# Patient Record
Sex: Male | Born: 1935 | Race: Black or African American | Hispanic: No | Smoking: Former smoker
Health system: Southern US, Community
[De-identification: ages and names within clinical notes are randomized; demographics above are authoritative.]

## PROBLEM LIST (undated history)

## (undated) DIAGNOSIS — A419 Sepsis, unspecified organism: Secondary | ICD-10-CM

## (undated) DIAGNOSIS — R6520 Severe sepsis without septic shock: Secondary | ICD-10-CM

## (undated) DIAGNOSIS — I10 Essential (primary) hypertension: Secondary | ICD-10-CM

## (undated) DIAGNOSIS — E785 Hyperlipidemia, unspecified: Secondary | ICD-10-CM

## (undated) DIAGNOSIS — E119 Type 2 diabetes mellitus without complications: Secondary | ICD-10-CM

## (undated) DIAGNOSIS — M109 Gout, unspecified: Secondary | ICD-10-CM

## (undated) DIAGNOSIS — C799 Secondary malignant neoplasm of unspecified site: Secondary | ICD-10-CM

## (undated) DIAGNOSIS — C259 Malignant neoplasm of pancreas, unspecified: Secondary | ICD-10-CM

---

## 1998-08-28 ENCOUNTER — Encounter: Payer: Self-pay | Admitting: Urology

## 1998-08-31 ENCOUNTER — Encounter: Payer: Self-pay | Admitting: Urology

## 1998-08-31 ENCOUNTER — Ambulatory Visit (HOSPITAL_COMMUNITY): Admission: RE | Admit: 1998-08-31 | Discharge: 1998-08-31 | Payer: Self-pay | Admitting: Urology

## 1998-09-21 ENCOUNTER — Ambulatory Visit (HOSPITAL_COMMUNITY): Admission: RE | Admit: 1998-09-21 | Discharge: 1998-09-21 | Payer: Self-pay | Admitting: Urology

## 1998-09-21 ENCOUNTER — Encounter: Payer: Self-pay | Admitting: Urology

## 1998-10-19 ENCOUNTER — Ambulatory Visit (HOSPITAL_COMMUNITY): Admission: RE | Admit: 1998-10-19 | Discharge: 1998-10-19 | Payer: Self-pay | Admitting: Urology

## 1998-10-19 ENCOUNTER — Encounter: Payer: Self-pay | Admitting: Urology

## 2001-06-15 ENCOUNTER — Emergency Department (HOSPITAL_COMMUNITY): Admission: EM | Admit: 2001-06-15 | Discharge: 2001-06-15 | Payer: Self-pay | Admitting: *Deleted

## 2003-08-17 ENCOUNTER — Ambulatory Visit (HOSPITAL_COMMUNITY): Admission: RE | Admit: 2003-08-17 | Discharge: 2003-08-17 | Payer: Self-pay | Admitting: Pulmonary Disease

## 2005-03-22 ENCOUNTER — Inpatient Hospital Stay (HOSPITAL_COMMUNITY): Admission: EM | Admit: 2005-03-22 | Discharge: 2005-03-24 | Payer: Self-pay | Admitting: Emergency Medicine

## 2005-03-23 ENCOUNTER — Ambulatory Visit: Payer: Self-pay | Admitting: Internal Medicine

## 2005-03-23 ENCOUNTER — Encounter (INDEPENDENT_AMBULATORY_CARE_PROVIDER_SITE_OTHER): Payer: Self-pay | Admitting: Specialist

## 2005-03-27 ENCOUNTER — Inpatient Hospital Stay (HOSPITAL_COMMUNITY): Admission: EM | Admit: 2005-03-27 | Discharge: 2005-04-02 | Payer: Self-pay | Admitting: Emergency Medicine

## 2007-09-11 ENCOUNTER — Observation Stay (HOSPITAL_COMMUNITY): Admission: EM | Admit: 2007-09-11 | Discharge: 2007-09-12 | Payer: Self-pay | Admitting: Emergency Medicine

## 2008-03-25 ENCOUNTER — Ambulatory Visit (HOSPITAL_COMMUNITY): Admission: RE | Admit: 2008-03-25 | Discharge: 2008-03-25 | Payer: Self-pay | Admitting: Ophthalmology

## 2008-03-31 ENCOUNTER — Ambulatory Visit (HOSPITAL_COMMUNITY): Admission: RE | Admit: 2008-03-31 | Discharge: 2008-03-31 | Payer: Self-pay | Admitting: Ophthalmology

## 2010-07-30 ENCOUNTER — Encounter: Payer: Self-pay | Admitting: Gastroenterology

## 2010-11-23 NOTE — Discharge Summary (Signed)
NAME:  [name] [name], [name] [name]               ACCOUNT NO.:  [id number]   MEDICAL RECORD NO.:  [id number]          PATIENT TYPE:  INP   LOCATION:  4715                         FACILITY:  [facility]   PHYSICIAN:  [name], M.D.DATE OF BIRTH:  22-Sep-1928   DATE OF ADMISSION:  03/27/2005  DATE OF DISCHARGE:  04/02/2005                                 DISCHARGE SUMMARY   DISCHARGE DIAGNOSES:  1.  Bleeding diverticulosis of the colon.  2.  Diabetes mellitus, type 2, uncontrolled, treated and improved.  3.  Anemia with chronic gastrointestinal blood loss, marked.  4.  Hypertensive heart disease.  5.  Gouty arthritis, asymptomatic.  6.  Degenerative joint disease of the extremities.  7.  Prostate hypertrophy, benign without urinary obstruction.  8.  Hypopotassemia, hypokalemia.  9.  Morbid obesity.  10. Internal hemorrhoids without complications.   GI consultant Charna Elizabeth, MD.  Endoscopist without surgical procedure on  the large intestine Jeani Hawking, MD.   BRIEF HISTORY:  The patient is a 77 year old African-American male with a  known history of diabetes mellitus type 2 on oral antidiabetic therapy, and  diverticulosis of the colon, who presented the [facility] Emergency  Department with severe rectal bleeding of unclear etiology of bright red  blood and was admitted for evaluation.  The patient had general weakness and  was found to be hypotensive with a systolic blood pressure in the 86 mmHg  range in the emergency department.  The patient related acute onset of  bleeding of bright red blood of mild degree when he went to the commode.  Past history revealed the patient to have a long history of hypertensive  heart disease as well as episodes of gouty arthritis, but no episodes of  marked rectal bleeding.  He denied a history of frequent intake of alcoholic  beverage and denied smoking.  The family history was noncontributory.   REVIEW OF SYSTEMS:  Reveals the patient's allergy  history to have no known  drug allergies.   PERTINENT PHYSICAL FINDINGS:  Pertinent physical findings on March 27, 2005 revealed the patient to have vital signs with a temperature of 98.3,  pulse 80 and regular, respirations 18, blood pressure of 86/50, but improved  to 158/80 after receiving IV fluids and blood transfusion on March 27, 2005.  The patient was observed to be a well-developed markedly obese  African-American male who was oriented x3 to time, place and person.  The  neck was supple with no masses.  The lungs were clear to auscultation.  The  heart had a regular rhythm with no rales.  Abdomen was markedly obese, but  was nontender and no masses or organomegaly detected.  The genitalia  revealed a normal external male genitalia.  The rectum revealed dark red  blood in the rectum, and no masses felt.  The extremities revealed mild  degenerative joint disease changes knees and ankles especially.   LABORATORY DATA:  Revealed the patient to have a serum glucose of 292 mg  percent with the other renal function being satisfactory.  The CBC revealed  a hemoglobin  that was down to 7 grams percent with hematocrit of 21 and a  WBC count of 8000 with a platelet count of 203,000.  The patient after a  blood transfusion had a hemoglobin that had improved to 9.6 to 10 grams  percent on March 29, 2005.  The patient was seen in GI consultation  initially by Charna Elizabeth, MD, and then followup procedure was done by  Jeani Hawking, MD with a colonoscopy initially on March 27, 2005 and a  followup procedure on March 31, 2005 after return of massive GI rectal  bleeding.  The patient was found to have diverticulosis and a small polyp  that was benign as well as his diverticulosis.  He was also found to have  some internal hemorrhoids that were small and not bleeding.  The patient  improved after the procedure.  The patient received NovoLog insulin for  sliding-scale coverage  especially when n.p.o.  He was restarted on his  Prandin 2 mg a.c. meals when he was able to eat and on discharge.   DISCHARGE MEDICATIONS:  Prandin 2 mg a.c. breakfast and a.c. 6 p.m. dinner  meal as well as his other medications that he took as an outpatient.  He was  continued on his Hyzaar 50/12.5 mg p.o. daily, terazosin 5 mg p.o. daily,  both for blood pressure.  Prandin as previously mentioned.  Allopurinol 100  mg p.o. daily, and his Travatan ophthalmic drops.   The patient was to have a followup in the office of Jill Alexanders.  MD on his October appointment.      [name], M.D.  Electronically Signed     GRK/MEDQ  D:  07/04/2005  T:  07/05/2005  Job:  [id number]   cc:   Anselmo Rod, M.D.  Fax: [phone]   Jordan Hawks. Elnoria Howard, MD  Fax: [phone]

## 2010-11-23 NOTE — Consult Note (Signed)
NAME:  [name] [name], [name] [name]               ACCOUNT NO.:  [id number]   MEDICAL RECORD NO.:  [id number]          PATIENT TYPE:  INP   LOCATION:  2925                         FACILITY:  [facility]   PHYSICIAN:  Anselmo Rod, M.D.  DATE OF BIRTH:  Oct 26, 1928   DATE OF CONSULTATION:  03/28/2005  DATE OF DISCHARGE:                                   CONSULTATION   REASON FOR CONSULTATION:  1.  Rectal bleeding with severe anemia, with hemoglobin of 7 g/dl.  Rule out      diverticular bleed versus a postpolypectomy bleed.  2.  Hypertension on terazosin and hydrochlorothiazide.  3.  Adult-onset diabetes mellitus on Prandin.  4.  Gouty arthritis on allopurinol.  5.  Morbid obesity.   RECOMMENDATIONS:  1.  Emergency colonoscopy (unprepped procedure) to be done now.  2.  We will give him a bolus of 500 mL of normal saline along with an      additional two units of blood and make further recommendations after the      procedure.  The risks and benefits of the procedure including worse      problems with bleeding, perforation requiring surgery, etc., have been      discussed with the patient and his family in great detail.   DISCUSSION:  [name] is a very pleasant 77 year old African-  American male.  He was admitted to [facility] on March 22, 2005, when he had rectal bleeding for the first time.  The patient had a  colonoscopy on March 23, 2005, by Iva Boop, M.D., when a polyp  was removed from 10 cm and pandiverticulosis was noted with internal  hemorrhoids.  The patient claims he never really stopped bleeding since his  discharge from the hospital that weekend and this morning while he was  eating breakfast noticed a large amount of bloody discharge on his chair at  the table.  He denies dizziness or syncope.  He had some abdominal cramping  at the time when he initially started bleeding but not since then.  He  denies a history of fever, chills or rigors.  His  appetite has been fairly  good.  His weight has been stable.  There is no history of diarrhea or  constipation, cardiorespiratory or genitourinary complaints.  He came to the  emergency room and was evaluated there and was found to be hypotensive with  a hemoglobin of 7 g/dl, and a GI consultation was procured.  The patient  denies the use of any nonsteroidal, including aspirin.   PAST MEDICAL HISTORY:  See list above.   ALLERGIES:  No known drug allergies.   MEDICATIONS:  Allopurinol, Prandin, terazosin, hydrochlorothiazide.   SOCIAL HISTORY:  He is married and lives with his wife in [place].   FAMILY HISTORY:  Noncontributory.   PHYSICAL EXAMINATION:  GENERAL:  Elderly African-American male in no acute  distress, lying comfortably on the stretcher, somewhat anxious in his  demeanor.  VITAL SIGNS:  Blood pressure 89/42, respiratory rate of 22 per minute, pulse  of 102 per minute and a  temperature of 98.5.  HEENT:  PERRLA, facial symmetry reserved, oropharyngeal mucosa without  exudate.  NECK:  Supple, no JVD, thyromegaly or lymphadenopathy.  CHEST:  Clear to auscultation.  CARDIAC:  S1, S2 regular.  ABDOMEN:  Soft, obese, nondistended, nontender, with normal bowel sounds.  No hepatosplenomegaly appreciated.  RECTAL:  Decreased sphincter tone with a large amount of clots on the  examining finger.   Laboratory evaluation on admission revealed a hemoglobin of 7 g/dl with  hematocrit 16%, MCV 87.6, white count 8000, platelets 203,000, neutrophils  88%.  PT 14.3, INR 1.1, PTT 26.  Sodium 140, potassium 3.7, chloride 107,  CO2 25, glucose 292, BUN 14, creatinine 1.4, calcium 7.8, total protein 4.7,  albumin 2.6, AST 24, ALT 10, alkaline phosphatase 56, total bilirubin 0.4.  His blood group was O.   Plans as above.  Further recommendation made in follow-up.      Anselmo Rod, M.D.  Electronically Signed     JNM/MEDQ  D:  03/27/2005  T:  03/28/2005  Job:  [id number]    cc:   [name], M.D.  [address] [place]  [place] [address]  Fax: [phone]   [name], M.D.  [address] [place] [address]  [place] [address]

## 2010-11-23 NOTE — Op Note (Signed)
NAME:  [name], [name]               ACCOUNT NO.:  [id number]   MEDICAL RECORD NO.:  [id number]          PATIENT TYPE:  AMB   LOCATION:  SDS                          FACILITY:  [facility]   PHYSICIAN:  Salley Scarlet., M.D.DATE OF BIRTH:  1928/12/07   DATE OF PROCEDURE:  DATE OF DISCHARGE:  03/31/2008                               OPERATIVE REPORT   PREOPERATIVE DIAGNOSIS:  Immature cataract, right eye.   POSTOPERATIVE DIAGNOSIS:  Immature cataract, right eye.   OPERATIONS:  Kelman phacoemulsification cataract, right eye.   ANESTHESIA:  Local using Xylocaine 2% with Marcaine 0.75% with Wydase.   JUSTIFICATION FOR PROCEDURE:  This is a 77 year old gentleman who  complains of blurring of vision with difficulty seeing to read and  write.  He was evaluated and found to have a visual acuity best  corrected at 20/60 on the right, 20/40 on the left.  There were  bilateral immature cataracts, worse on the right than the left.  The  cataract extraction with intraocular lens implantation was recommended.  He was admitted at this time for that purpose.   PROCEDURE IN DETAIL:  Under influence of IV sedation, Van Lint akinesia,  and retrobulbar anesthesia was given.  The patient was prepped and  draped in the usual manner.  The lid speculum was inserted under the  upper and lower lid of the right eye and a 4-0 silk traction suture was  passed through the belly of the superior rectus muscle retraction.  A  fornix-based conjunctival flap was turned and hemostasis was achieved  using cautery.  An incision was made in the sclera at the limbus.  This  incision was dissected down to clear cornea using crescent blade.  A  sideport incision made at 1:30 o'clock position.  OcuCoat was injected  into the eye through the sideport incision.  The anterior chamber was  then entered through the corneoscleral tunnel incision at 11:30 o'clock  position using a 2.75 mm keratome.  An anterior capsulotomy was  done  using a bent #25 gauge needle and the nucleus was hydrodissected using  Xylocaine.  The KPE handpiece was passed into the eye and the nucleus  was emulsified without difficulty.  The residual cortical material was  aspirated.  The posterior capsule was polished using olive-tip polisher.  The wound was widened slightly to accommodate a foldable silicone lens.  The lens was seated into the eye behind the iris without difficulty.  The anterior chamber was reformed and the pupil was constricted using  Miochol.  The lips of the wound were hydrated and tested to make sure  that there was no leak.  After ascertaining there was no leak, the  conjunctiva was closed over the wound using thermal cautery.  A 1 mL of  Celestone and 0.5 cc of gentamicin were injected subconjunctivally.  Maxitrol ophthalmic ointment and Pontocaine ointment were applied along  with a patch and Fox shield.  The patient tolerated the procedure well  and was discharged to post  anesthesia recovery room in satisfactory condition.  He is instructed to  rest today, to take Vicodin  every 4 hours as needed for pain, and see me  in office tomorrow for further evaluation.   DISCHARGE DIAGNOSIS:  Immature cataract, right eye.      Salley Scarlet., M.D.  Electronically Signed     TB/MEDQ  D:  04/02/2008  T:  04/03/2008  Job:  [id number]

## 2010-11-23 NOTE — Consult Note (Signed)
NAME:  [name] [name], [name] [name]               ACCOUNT NO.:  [id number]   MEDICAL RECORD NO.:  [id number]          PATIENT TYPE:  INP   LOCATION:  2925                         FACILITY:  [facility]   PHYSICIAN:  Maisie Fus A. Cornett, M.D.DATE OF BIRTH:  1929/02/26   DATE OF CONSULTATION:  03/27/2005  DATE OF DISCHARGE:                                   CONSULTATION   REASON FOR CONSULTATION:  Hematochezia.   HISTORY OF PRESENT ILLNESS:  The patient is a 77 year old male admitted last  week due to bright red blood per rectum.  He underwent a colonoscopy which  showed pandiverticulosis as well as a polyp at 12 cm, which was found to be  a tubulovillous adenoma with final pathology with no atypia.  He was in his  usual state of health until today, when he developed hematochezia, getting  crampy abdominal pain.  He was brought to the hospital and underwent  emergent colonoscopy and was found to have bleeding from his right colon, it  appeared from a diverticulum.  Of note, there was no mention of the  polypectomy site in the procedure note that I could see.  At this point in  time the dictated note was not complete.  We were asked to see the patient  at the request of Dr. Freida Busman in consultation for lower GI bleeding from a  right-sided diverticulum, which was controlled colonoscopically earlier  today.  The patient currently states that he does not feel well and has some  nausea.  He still has periumbilical abdominal pain, which is no longer an  8/10 but much less.  There was no radiation.  Hemodynamically he has been  stable.   PAST MEDICAL HISTORY:  1.  Gout.  2.  Type 2 diabetes.  3.  Obesity.  4.  Benign prostatic hypertrophy.   PAST SURGICAL HISTORY:  Elective elbow surgery in the past.   ALLERGIES:  No known drug allergies.   MEDICATIONS:  1.  Allopurinol.  2.  Hyzaar 50/12.5 mg per day.  3.  Terazosin 5 mg a day.   FAMILY HISTORY:  Noncontributory.   SOCIAL HISTORY:  He denies tobacco or  alcohol use.  He is married.   REVIEW OF SYSTEMS:  A 10-review of systems reviewed.  All were negative  except for crampy abdominal pain and blood per rectum.   PHYSICAL EXAMINATION:  VITAL SIGNS:  Blood pressure 154/80, pulse 89,  respiratory rate 20.  GENERAL:  A pleasant male in no apparent distress.  HEENT:  No scleral icterus, oropharynx clear.  NECK:  Supple, nontender.  CHEST:  Clear.  CARDIOVASCULAR:  Regular rate and rhythm.  ABDOMEN:  Soft, minimally tender around the umbilicus.  Minimal distention.  EXTREMITIES:  1+ edema noted.   LABORATORY DATA:  Hemoglobin 7.0 at 1 o'clock today, white count is 8000,  platelet count is 203,000.  Sodium 140, potassium 3.7, chloride 107, CO2 25,  BUN 14, creatinine 1.4, glucose 298.  PT and PTT within normal limits.   IMPRESSION:  Lower gastrointestinal bleed in a patient with  pandiverticulosis with bleeding from the right-sided  colon by colonoscopy as  well as history of tubulovillous adenoma at 12 cm with no atypia noted,  removed colonoscopically on March 23, 2005.   PLAN:  At this point in time he is hemodynamically stable but at some point  may require colectomy, which in this case may be a total abdominal colectomy  to encompass the majority of his diverticula, as he is bleeding at this  point in time.  He currently is stable.  There is no acute surgical  indication at this point in time.  He will be followed by the [facility] M.D. call  service at this point time, but we will be available if he does become  hemodynamically unstable and does require surgery.   Thank you for this consultation.      Thomas A. Cornett, M.D.  Electronically Signed     TAC/MEDQ  D:  03/27/2005  T:  03/28/2005  Job:  [id number]

## 2010-11-23 NOTE — Consult Note (Signed)
NAME:  [name] [name]               ACCOUNT NO.:  [id number]   MEDICAL RECORD NO.:  [id number]          PATIENT TYPE:  EMS   LOCATION:  MAJO                         FACILITY:  [facility]   PHYSICIAN:  Jordan Hawks. Elnoria Howard, MD    DATE OF BIRTH:  1928/08/08   DATE OF CONSULTATION:  03/22/2005  DATE OF DISCHARGE:                                   CONSULTATION   REASON FOR CONSULTATION:  Hematochezia.   HISTORY OF PRESENT ILLNESS:  This is a 77 year old black male with a past  medical history of gout, hypertension, benign prostatic hypertrophy, and  diabetes, who states having two episodes of hematochezia.  The patient noted  having some abdominal pain and subsequently was followed by a bout of  hematochezia that occurred at 7 a.m. this morning.  He subsequently had a  second episode several hours later and presented to his primary care  Skyra Crichlow with these type of complaints.  He was then referred to the [facility] for further evaluation.  His hemoglobin was drawn in the  office and was found to be at 10.4.  At this time, there is no baseline  hemoglobin for comparison and none in the E-charts.  The patient states that  he had a colonoscopy that was performed in 2005 but he is unable to state  the physicians name.  Per his report, he states that the colonoscopy was  normal.  The patient denies any chest pain or shortness of breath and denies  any NSAID use.  The abdominal pain was fleeting and did precede the  hematochezia.   PAST MEDICAL AND SURGICAL HISTORY:  As stated above.   ALLERGIES:  No known drug allergies.   MEDICATIONS:  Allopurinol 100 mg, Hyzaar 50/12.5 mg, Terazosin 5 mg, Prandin  2 mg.   FAMILY HISTORY:  Noncontributory.   SOCIAL HISTORY:  The patient is married and lives with his wife.  No  tobacco, alcohol, or illicit drug use.   REVIEW OF SYMPTOMS:  As per the history of present illness, negative for any  blurred vision, dysuria, dysarthria, chest pain,  shortness of breath,  arthralgias, arthritis, myalgias, skin rashes.   PHYSICAL EXAMINATION:  VITAL SIGNS:  Blood pressure 107/42, heart rate 72, temperature 97.4,  respirations 16.  GENERAL:  The patient is in no acute distress, alert and oriented.  HEENT:  Normocephalic, atraumatic, extraocular movements intact, pupils  equal, round, reactive to light.  NECK:  Supple, no lymphadenopathy.  LUNGS:  Clear to auscultation bilaterally.  ABDOMEN:  Obese, soft, nontender, nondistended, positive bowel sounds.  EXTREMITIES:  No cyanosis, clubbing, and edema.  RECTAL:  Negative for any palpable masses, however, gross blood.   LABORATORY DATA:  White blood cell count 6.2, hemoglobin 10.4, platelets  186.   IMPRESSION:  1.  Hematochezia, the differential diagnosis for this finding is ischemic      colitis versus diverticular bleed.  Unfortunately, I do not have the      prior records for evaluation in regards to his prior colonoscopy, but      the patient's clinical history  is consistent with an ischemic colitis      and elevation in his white blood cell count, the patient appears to be      clinically stable at this time.  2.  Gout.   PLAN:  1.  To admit to the hospital by Dr. Petra Kuba.  2.  Follow H&H q.12h.  3.  Type and screen.  4.  Prep for colonoscopy in the morning.      Jordan Hawks Elnoria Howard, MD  Electronically Signed     PDH/MEDQ  D:  03/22/2005  T:  03/22/2005  Job:  [id number]

## 2010-11-23 NOTE — Op Note (Signed)
NAME:  [name] [name], [name] [name]               ACCOUNT NO.:  [id number]   MEDICAL RECORD NO.:  [id number]          PATIENT TYPE:  INP   LOCATION:  2925                         FACILITY:  [facility]   PHYSICIAN:  Anselmo Rod, M.D.  DATE OF BIRTH:  May 08, 1929   DATE OF PROCEDURE:  03/27/2005  DATE OF DISCHARGE:                                 OPERATIVE REPORT   PROCEDURE PERFORMED:  Colonoscopy with control of bleeding.   ENDOSCOPIST:  Charna Elizabeth, M.D.   INSTRUMENT USED:  Olympus video colonoscope.   INDICATIONS FOR PROCEDURE:  A 77 year old African-American male with a  history of rectal bleeding. The patient has had colonoscopy on March 22, 2005 when he was found to have pan diverticulosis with a large polyp removed  from 10 cm.  Rule out postpolypectomy bleed.   PREPROCEDURE PREPARATION:  Informed consent was procured from the patient  and his family.  The risks and benefits were discussed with them in great  detail.  The patient received two units of packed red blood cells prior to  the procedure along with a liter of normal saline.  Risks and benefits of  the procedure including the miss rate of cancer and polyps especially in a  circumstance like his where he is having an unprepped colonoscopy were  discussed with them as well.   PREPROCEDURE PHYSICAL:  The patient has a blood pressure of 89/42 with pulse  of 102 per minute, respiratory rate 22, temperature 98.5.  HEENT: ATNC, oral  mucosa without exudate.  Neck supple.  Chest clear to auscultation.  S1 and  S2 regular.  Abdomen obese, nontender with normal bowel sounds.   DESCRIPTION OF PROCEDURE:  The patient was placed in left lateral decubitus  position and sedated with Demerol 10 mg intravenously along with Versed 3 mg  IV.  The patient's blood pressure somewhat improved before starting the  procedure and his pressures were actually 110/70.  The patient was gently  sedated and once he was adequately positioned, the Olympus  video colonoscope  was advanced from the rectum to the cecum with difficulty.  There was a  large amount of blood and stool in the colon.  Multiple washes were done.  The appendicular orifice and ileocecal valve were visualized. A fresh clot  was noted in the proximal right colon.  Multiple washes were done in this  area and after several attempts and changing of patient's position from the  left lateral to the supine and the right lateral position, a visible vessel  was noted in the diverticulum in the proximal right colon.  5 mL of  epinephrine was injected around the vessel and three clips were applied.  Quick clip did not deploy directly onto the visible vessel.  However, two  Resolution clips were applied on the visible vessel and hemostasis was  achieved.  Multiple washes were done by withdrawing the scope.  The  polypectomy site at 10 cm could not be visualized because of the poor prep.  The patient tolerated the procedure well without complication.  Two more  units of packed red blood  cells were procured from the blood bank during the  procedure and hung a long with another 500 mL of normal saline times two.  The findings were discussed with [name] [name], M.D. over the phone.   IMPRESSION:  1.  Diverticular bleed with a visible vessel in the proximal right colon.      Resolution clips applied.  Epinephrine injected around it.  Hemostasis      achieved.  2.  Internal hemorrhoids.  3.  Status post polypectomy on March 22, 2005.  Polypectomy site not      visualized today.   RECOMMENDATIONS:  1.  CBC will be done after the fourth unit of blood has been given to the      patient.  2.  He will be monitored closely in the step down unit.  3.  Dr. Luisa Hart from [facility] has been informed about the      patient for surgical back up.  4.  If the patient has any evidence of ongoing bleeding, an arteriogram with      possible selective embolization has been  recommended.  If that fails,      the surgical approach should be considered.      Anselmo Rod, M.D.  Electronically Signed     JNM/MEDQ  D:  03/27/2005  T:  03/28/2005  Job:  [id number]   cc:   [name] [name], M.D.  [address] [place]  [place] [address]  Fax: [phone]   [name], M.D.  [address] [place] [address]  [place] [address]

## 2011-04-01 LAB — CBC
HCT: 37.4 — ABNORMAL LOW
HCT: 39.3
Hemoglobin: 12.9 — ABNORMAL LOW
Platelets: 188
Platelets: 189
RDW: 14.7
RDW: 15.1
WBC: 6.5
WBC: 7.5

## 2011-04-01 LAB — DIFFERENTIAL
Eosinophils Relative: 0
Lymphocytes Relative: 16
Lymphs Abs: 1.2
Monocytes Absolute: 0.6

## 2011-04-01 LAB — COMPREHENSIVE METABOLIC PANEL
Albumin: 3.2 — ABNORMAL LOW
Alkaline Phosphatase: 91
BUN: 13
Calcium: 8.8
Potassium: 3.4 — ABNORMAL LOW
Total Protein: 6.1

## 2011-04-01 LAB — POCT I-STAT CREATININE: Creatinine, Ser: 1.2

## 2011-04-01 LAB — CARDIAC PANEL(CRET KIN+CKTOT+MB+TROPI)
CK, MB: 1.3
Relative Index: 1.1
Troponin I: 0.01

## 2011-04-01 LAB — BASIC METABOLIC PANEL
BUN: 10
Calcium: 8.7
Creatinine, Ser: 0.99
GFR calc non Af Amer: >60

## 2011-04-01 LAB — I-STAT 8, (EC8 V) (CONVERTED LAB)
BUN: 17
Bicarbonate: 29.2 — ABNORMAL HIGH
Glucose, Bld: 81
Hemoglobin: 15
pCO2, Ven: 49.8

## 2011-04-01 LAB — HEPATIC FUNCTION PANEL
ALT: 12
Alkaline Phosphatase: 96
Bilirubin, Direct: <0.1
Total Bilirubin: 0.4

## 2011-04-01 LAB — LIPID PANEL
HDL: 42
LDL Cholesterol: 69
Total CHOL/HDL Ratio: 3
Triglycerides: 79
VLDL: 16

## 2011-04-08 LAB — URINALYSIS, ROUTINE W REFLEX MICROSCOPIC
Bilirubin Urine: NEGATIVE
Hgb urine dipstick: NEGATIVE
Nitrite: NEGATIVE
Specific Gravity, Urine: 1.022
Urobilinogen, UA: 0.2
pH: 5

## 2011-04-08 LAB — CBC
HCT: 39.7
Hemoglobin: 12.8 — ABNORMAL LOW
Platelets: 194
WBC: 6.8

## 2011-04-08 LAB — GLUCOSE, CAPILLARY
Glucose-Capillary: 103 — ABNORMAL HIGH
Glucose-Capillary: 137 — ABNORMAL HIGH
Glucose-Capillary: 256 — ABNORMAL HIGH

## 2011-04-08 LAB — BASIC METABOLIC PANEL
Calcium: 9.2
GFR calc non Af Amer: >60
Potassium: 3.8
Sodium: 144

## 2011-04-10 LAB — BASIC METABOLIC PANEL
BUN: 16
Chloride: 104
Creatinine, Ser: 1.33
GFR calc Af Amer: >60
GFR calc non Af Amer: 52 — ABNORMAL LOW

## 2011-04-10 LAB — URINALYSIS, ROUTINE W REFLEX MICROSCOPIC
Glucose, UA: 250 — AB
Ketones, ur: 15 — AB
Nitrite: NEGATIVE
Specific Gravity, Urine: 1.023
pH: 5

## 2011-04-10 LAB — CBC
MCV: 87.3
Platelets: 204
RBC: 4.49
WBC: 6.5

## 2013-09-12 ENCOUNTER — Emergency Department (HOSPITAL_COMMUNITY): Payer: Medicare Other

## 2013-09-12 ENCOUNTER — Emergency Department (INDEPENDENT_AMBULATORY_CARE_PROVIDER_SITE_OTHER)
Admission: EM | Admit: 2013-09-12 | Discharge: 2013-09-12 | Disposition: A | Discharge status: Nursing Home (Includes ICF) | Payer: Medicare Other | Source: Home / Self Care | Attending: Emergency Medicine | Admitting: Emergency Medicine

## 2013-09-12 ENCOUNTER — Emergency Department (HOSPITAL_COMMUNITY)
Admission: EM | Admit: 2013-09-12 | Discharge: 2013-09-12 | Disposition: A | Discharge status: Home / Self Care | Payer: Medicare Other | Attending: Emergency Medicine | Admitting: Emergency Medicine

## 2013-09-12 ENCOUNTER — Encounter (HOSPITAL_COMMUNITY): Payer: Self-pay | Admitting: Emergency Medicine

## 2013-09-12 DIAGNOSIS — E119 Type 2 diabetes mellitus without complications: Secondary | ICD-10-CM | POA: Insufficient documentation

## 2013-09-12 DIAGNOSIS — R42 Dizziness and giddiness: Secondary | ICD-10-CM | POA: Insufficient documentation

## 2013-09-12 DIAGNOSIS — R519 Headache, unspecified: Secondary | ICD-10-CM

## 2013-09-12 DIAGNOSIS — R51 Headache: Secondary | ICD-10-CM | POA: Insufficient documentation

## 2013-09-12 DIAGNOSIS — G459 Transient cerebral ischemic attack, unspecified: Secondary | ICD-10-CM

## 2013-09-12 DIAGNOSIS — I10 Essential (primary) hypertension: Secondary | ICD-10-CM | POA: Insufficient documentation

## 2013-09-12 DIAGNOSIS — M109 Gout, unspecified: Secondary | ICD-10-CM | POA: Insufficient documentation

## 2013-09-12 DIAGNOSIS — Z87891 Personal history of nicotine dependence: Secondary | ICD-10-CM | POA: Insufficient documentation

## 2013-09-12 HISTORY — DX: Hyperlipidemia, unspecified: E78.5

## 2013-09-12 HISTORY — DX: Gout, unspecified: M10.9

## 2013-09-12 HISTORY — DX: Essential (primary) hypertension: I10

## 2013-09-12 HISTORY — DX: Type 2 diabetes mellitus without complications: E11.9

## 2013-09-12 LAB — BASIC METABOLIC PANEL
BUN: 18 mg/dL (ref 6–23)
CHLORIDE: 106 meq/L (ref 96–112)
CO2: 26 mEq/L (ref 19–32)
Calcium: 8.8 mg/dL (ref 8.4–10.5)
Creatinine, Ser: 1.38 mg/dL — ABNORMAL HIGH (ref 0.50–1.35)
GFR calc non Af Amer: 46 mL/min — ABNORMAL LOW (ref >90)
GFR, EST AFRICAN AMERICAN: 53 mL/min — AB (ref >90)
Glucose, Bld: 175 mg/dL — ABNORMAL HIGH (ref 70–99)
Potassium: 3.5 mEq/L — ABNORMAL LOW (ref 3.7–5.3)
Sodium: 147 mEq/L (ref 137–147)

## 2013-09-12 LAB — URINALYSIS, ROUTINE W REFLEX MICROSCOPIC
Glucose, UA: NEGATIVE mg/dL
KETONES UR: 15 mg/dL — AB
Leukocytes, UA: NEGATIVE
Nitrite: NEGATIVE
PH: 5 (ref 5.0–8.0)
Protein, ur: NEGATIVE mg/dL
SPECIFIC GRAVITY, URINE: 1.024 (ref 1.005–1.030)
UROBILINOGEN UA: 0.2 mg/dL (ref 0.0–1.0)

## 2013-09-12 LAB — CBC WITH DIFFERENTIAL/PLATELET
BASOS ABS: 0 10*3/uL (ref 0.0–0.1)
BASOS PCT: 0 % (ref 0–1)
Eosinophils Absolute: 0 10*3/uL (ref 0.0–0.7)
Eosinophils Relative: 0 % (ref 0–5)
HEMATOCRIT: 32.8 % — AB (ref 39.0–52.0)
HEMOGLOBIN: 10.5 g/dL — AB (ref 13.0–17.0)
LYMPHS PCT: 13 % (ref 12–46)
Lymphs Abs: 0.8 10*3/uL (ref 0.7–4.0)
MCH: 24.9 pg — ABNORMAL LOW (ref 26.0–34.0)
MCHC: 32 g/dL (ref 30.0–36.0)
MCV: 77.7 fL — ABNORMAL LOW (ref 78.0–100.0)
MONOS PCT: 10 % (ref 3–12)
Monocytes Absolute: 0.6 10*3/uL (ref 0.1–1.0)
NEUTROS ABS: 4.7 10*3/uL (ref 1.7–7.7)
Neutrophils Relative %: 77 % (ref 43–77)
Platelets: 222 10*3/uL (ref 150–400)
RBC: 4.22 MIL/uL (ref 4.22–5.81)
RDW: 18.3 % — AB (ref 11.5–15.5)
WBC: 6.1 10*3/uL (ref 4.0–10.5)

## 2013-09-12 LAB — URINE MICROSCOPIC-ADD ON

## 2013-09-12 LAB — TROPONIN I: Troponin I: <0.3 ng/mL (ref <0.30)

## 2013-09-12 LAB — GLUCOSE, CAPILLARY: GLUCOSE-CAPILLARY: 195 mg/dL — AB (ref 70–99)

## 2013-09-12 MED ORDER — SODIUM CHLORIDE 0.9 % IV BOLUS (SEPSIS)
500.0000 mL | Freq: Once | INTRAVENOUS | Status: AC
  Administered 2013-09-12: 500 mL via INTRAVENOUS

## 2013-09-12 NOTE — ED Notes (Signed)
Report called to Kim, ED First Nurse. 

## 2013-09-12 NOTE — ED Notes (Signed)
Daughter states she also noted pt's left eyelid was droopy during episode, which has since resolved.  Pt states HA and slight dizziness lasted approx 30 min, and occurred @ approx 1530.

## 2013-09-12 NOTE — Discharge Instructions (Signed)
We have determined that your problem requires further evaluation in the emergency department.  We will take care of your transport there.  Once at the emergency department, you will be evaluated by a provider and they will order whatever treatment or tests they deem necessary.  We cannot guarantee that they will do any specific test or do any specific treatment.  ° °

## 2013-09-12 NOTE — ED Notes (Signed)
Pt states that he was at church felt fine and around 15:00 this afternoon started getting dizzy. Pt states that he has been dizzy in the last and the dizziness only lasted for an hour. Pt alert and oriented, able to follow commands and move all extremities. Pt able to hold arms and legs in air and also able to stand at bedside to use urinal. Pt family states that he was also having a left eye droop and his speech of slightly altered than normal. Pt currently to patient family is better and no longer having droop or abnormal speech.

## 2013-09-12 NOTE — ED Provider Notes (Signed)
TIME SEEN: 8:00 PM  CHIEF COMPLAINT: Headache, lightheadedness  HPI: Patient is a 80 year old male with history of diabetes, hypertension, hyperlipidemia who presented to the emergency department with diffuse, throbbing headache and lightheadedness that lasted for about 15-20 minutes after eating lunch at 3:30 PM today. He denies that he had any sudden onset headache, thunderclap headache. No numbness, tingling or focal weakness. No changes in his vision or hearing. No chest pain or shortness of breath. No recent fever, vomiting or diarrhea, bloody stools or melena, cough. He was seen in urgent care and his family was concerned at one point he may have had some drooping of the left eye which is now gone. No facial droop however. He was sent to the emergency department for further evaluation.  ROS: See HPI Constitutional: no fever  Eyes: no drainage  ENT: no runny nose   Cardiovascular:  no chest pain  Resp: no SOB  GI: no vomiting GU: no dysuria Integumentary: no rash  Allergy: no hives  Musculoskeletal: no leg swelling  Neurological: no slurred speech ROS otherwise negative  PAST MEDICAL HISTORY/PAST SURGICAL HISTORY:  Past Medical History  Diagnosis Date  . Hypertension   . Diabetes mellitus without complication   . Gout   . Hyperlipidemia     MEDICATIONS:  Prior to Admission medications   Medication Sig Start Date End Date Taking? Authorizing Provider  allopurinol (ZYLOPRIM) 100 MG tablet Take 100 mg by mouth daily.    Historical Provider, MD  atorvastatin (LIPITOR) 20 MG tablet Take 20 mg by mouth daily.    Historical Provider, MD  glipiZIDE (GLUCOTROL XL) 10 MG 24 hr tablet Take 10 mg by mouth daily with breakfast.    Historical Provider, MD  losartan (COZAAR) 100 MG tablet Take 100 mg by mouth daily.    Historical Provider, MD  metFORMIN (GLUCOPHAGE) 500 MG tablet Take by mouth. 500mg  q AM, 1000mg  q evening with dinner    Historical Provider, MD  nebivolol (BYSTOLIC) 5 MG  tablet Take 5 mg by mouth. 4 days/week    Historical Provider, MD  saxagliptin HCl (ONGLYZA) 5 MG TABS tablet Take 5 mg by mouth daily.    Historical Provider, MD  terazosin (HYTRIN) 5 MG capsule Take 5 mg by mouth every morning.    Historical Provider, MD    ALLERGIES:  Allergies  Allergen Reactions  . Ace Inhibitors     Listed on allergy list in wallet; unk reason per pt  . Capoten [Captopril]     Listed on allergy list in wallet; unk reason per pt  . Glucophage [Metformin]     Listed on pt's med card under allergies, but pt currently on metformin  . Vasotec [Enalapril]     Listed on allergy list in pt's wallet; unk reason per pt    SOCIAL HISTORY:  History  Substance Use Topics  . Smoking status: Former Research scientist (life sciences)  . Smokeless tobacco: Not on file  . Alcohol Use: No    FAMILY HISTORY: No family history on file.  EXAM: BP 144/69  Pulse 85  Temp(Src) 98.4 F (36.9 C) (Oral)  Resp 16  Ht 5\' 10"  (1.778 m)  Wt 213 lb 9 oz (96.871 kg)  BMI 30.64 kg/m2  SpO2 98% CONSTITUTIONAL: Alert and oriented and responds appropriately to questions. Well-appearing; well-nourished HEAD: Normocephalic EYES: Conjunctivae clear, PERRL ENT: normal nose; no rhinorrhea; moist mucous membranes; pharynx without lesions noted NECK: Supple, no meningismus, no LAD  CARD: RRR; S1 and S2 appreciated; no  murmurs, no clicks, no rubs, no gallops RESP: Normal chest excursion without splinting or tachypnea; breath sounds clear and equal bilaterally; no wheezes, no rhonchi, no rales,  ABD/GI: Normal bowel sounds; non-distended; soft, non-tender, no rebound, no guarding BACK:  The back appears normal and is non-tender to palpation, there is no CVA tenderness EXT: Normal ROM in all joints; non-tender to palpation; no edema; normal capillary refill; no cyanosis    SKIN: Normal color for age and race; warm NEURO: Moves all extremities equally; strength 5/5 in all 4 extremities, sensation to light touch intact  diffusely, cranial nerves II through XII intact PSYCH: The patient's mood and manner are appropriate. Grooming and personal hygiene are appropriate.  MEDICAL DECISION MAKING: Patient had an episode of lightheadedness and headache earlier today. Patient's daughter thought she noticed some drooping of his upper left eyelid but no facial droop. Patient denies that he had any neurologic deficits during this episode. This seems very atypical for TIA given he was only the left upper eyelid and no other facial droop or other neurologic deficits. He is now back to his baseline without complaints. He is hemodynamically stable. We'll check basic labs, EKG, urine, CT head.  ED PROGRESS: Patient's labs and imaging are completely unremarkable.   I am not concerned that this is a TIA as his symptoms were very atypical - possible L eyelid droop and lightheadedness (no vertigo).  He does have new RBBB on EKG but no other cardiac symptoms.  Given his symptoms are greater than 6 hours ago, I feel he is safe to be discharged home with one set of cardiac enzymes. Have discussed return precautions with patient. He states he does not want to be admitted to the hospital would like to be discharged home. Patient and wife both verbalize understanding and are comfortable plan. We'll have them followup with her primary care physician.        [place], DO 09/13/13 [id number]

## 2013-09-12 NOTE — ED Notes (Signed)
Per family member: daughter noticed pt sitting in chair with TV off - when questioned pt, he stated he felt dizzy and had a HA.  Pt layed down, then sxs resolved.  Denies any c/o's at all at this time.  Assessment per Dr. Jake Michaelis.

## 2013-09-12 NOTE — Discharge Instructions (Signed)
Dizziness Dizziness is a common problem. It is a feeling of unsteadiness or lightheadedness. You may feel like you are about to faint. Dizziness can lead to injury if you stumble or fall. A person of any age group can suffer from dizziness, but dizziness is more common in older adults. CAUSES  Dizziness can be caused by many different things, including:  Middle ear problems.  Standing for too long.  Infections.  An allergic reaction.  Aging.  An emotional response to something, such as the sight of blood.  Side effects of medicines.  Fatigue.  Problems with circulation or blood pressure.  Excess use of alcohol, medicines, or illegal drug use.  Breathing too fast (hyperventilation).  An arrhythmia or problems with your heart rhythm.  Low red blood cell count (anemia).  Pregnancy.  Vomiting, diarrhea, fever, or other illnesses that cause dehydration.  Diseases or conditions such as Parkinson's disease, high blood pressure (hypertension), diabetes, and thyroid problems.  Exposure to extreme heat. DIAGNOSIS  To find the cause of your dizziness, your caregiver may do a physical exam, lab tests, radiologic imaging scans, or an electrocardiography test (ECG).  TREATMENT  Treatment of dizziness depends on the cause of your symptoms and can vary greatly. HOME CARE INSTRUCTIONS   Drink enough fluids to keep your urine clear or pale yellow. This is especially important in very hot weather. In the elderly, it is also important in cold weather.  If your dizziness is caused by medicines, take them exactly as directed. When taking blood pressure medicines, it is especially important to get up slowly.  Rise slowly from chairs and steady yourself until you feel okay.  In the morning, first sit up on the side of the bed. When this seems okay, stand slowly while holding onto something until you know your balance is fine.  If you need to stand in one place for a long time, be sure to  move your legs often. Tighten and relax the muscles in your legs while standing.  If dizziness continues to be a problem, have someone stay with you for a day or two. Do this until you feel you are well enough to stay alone. Have the person call your caregiver if he or she notices changes in you that are concerning.  Do not drive or use heavy machinery if you feel dizzy.  Do not drink alcohol. SEEK IMMEDIATE MEDICAL CARE IF:   Your dizziness or lightheadedness gets worse.  You feel nauseous or vomit.  You develop problems with talking, walking, weakness, or using your arms, hands, or legs.  You are not thinking clearly or you have difficulty forming sentences. It may take a friend or family member to determine if your thinking is normal.  You develop chest pain, abdominal pain, shortness of breath, or sweating.  Your vision changes.  You notice any bleeding.  You have side effects from medicine that seems to be getting worse rather than better. MAKE SURE YOU:   Understand these instructions.  Will watch your condition.  Will get help right away if you are not doing well or get worse. Document Released: 12/18/2000 Document Revised: 09/16/2011 Document Reviewed: 01/11/2011 [facility] Patient Information 2014 [place], [place].  Headaches, Frequently Asked Questions MIGRAINE HEADACHES Q: What is migraine? What causes it? How can I treat it? A: Generally, migraine headaches begin as a dull ache. Then they develop into a constant, throbbing, and pulsating pain. You may experience pain at the temples. You may experience pain at  the front or back of one or both sides of the head. The pain is usually accompanied by a combination of:  Nausea.  Vomiting.  Sensitivity to light and noise. Some people (about 15%) experience an aura (see below) before an attack. The cause of migraine is believed to be chemical reactions in the brain. Treatment for migraine may include over-the-counter or  prescription medications. It may also include self-help techniques. These include relaxation training and biofeedback.  Q: What is an aura? A: About 15% of people with migraine get an "aura". This is a sign of neurological symptoms that occur before a migraine headache. You may see wavy or jagged lines, dots, or flashing lights. You might experience tunnel vision or blind spots in one or both eyes. The aura can include visual or auditory hallucinations (something imagined). It may include disruptions in smell (such as strange odors), taste or touch. Other symptoms include:  Numbness.  A "pins and needles" sensation.  Difficulty in recalling or speaking the correct word. These neurological events may last as long as 60 minutes. These symptoms will fade as the headache begins. Q: What is a trigger? A: Certain physical or environmental factors can lead to or "trigger" a migraine. These include:  Foods.  Hormonal changes.  Weather.  Stress. It is important to remember that triggers are different for everyone. To help prevent migraine attacks, you need to figure out which triggers affect you. Keep a headache diary. This is a good way to track triggers. The diary will help you talk to your healthcare professional about your condition. Q: Does weather affect migraines? A: Bright sunshine, hot, humid conditions, and drastic changes in barometric pressure may lead to, or "trigger," a migraine attack in some people. But studies have shown that weather does not act as a trigger for everyone with migraines. Q: What is the link between migraine and hormones? A: Hormones start and regulate many of your body's functions. Hormones keep your body in balance within a constantly changing environment. The levels of hormones in your body are unbalanced at times. Examples are during menstruation, pregnancy, or menopause. That can lead to a migraine attack. In fact, about three quarters of all women with migraine  report that their attacks are related to the menstrual cycle.  Q: Is there an increased risk of stroke for migraine sufferers? A: The likelihood of a migraine attack causing a stroke is very remote. That is not to say that migraine sufferers cannot have a stroke associated with their migraines. In persons under age 34, the most common associated factor for stroke is migraine headache. But over the course of a person's normal life span, the occurrence of migraine headache may actually be associated with a reduced risk of dying from cerebrovascular disease due to stroke.  Q: What are acute medications for migraine? A: Acute medications are used to treat the pain of the headache after it has started. Examples over-the-counter medications, NSAIDs, ergots, and triptans.  Q: What are the triptans? A: Triptans are the newest class of abortive medications. They are specifically targeted to treat migraine. Triptans are vasoconstrictors. They moderate some chemical reactions in the brain. The triptans work on receptors in your brain. Triptans help to restore the balance of a neurotransmitter called serotonin. Fluctuations in levels of serotonin are thought to be a main cause of migraine.  Q: Are over-the-counter medications for migraine effective? A: Over-the-counter, or "OTC," medications may be effective in relieving mild to moderate pain and associated symptoms  of migraine. But you should see your caregiver before beginning any treatment regimen for migraine.  Q: What are preventive medications for migraine? A: Preventive medications for migraine are sometimes referred to as "prophylactic" treatments. They are used to reduce the frequency, severity, and length of migraine attacks. Examples of preventive medications include antiepileptic medications, antidepressants, beta-blockers, calcium channel blockers, and NSAIDs (nonsteroidal anti-inflammatory drugs). Q: Why are anticonvulsants used to treat migraine? A:  During the past few years, there has been an increased interest in antiepileptic drugs for the prevention of migraine. They are sometimes referred to as "anticonvulsants". Both epilepsy and migraine may be caused by similar reactions in the brain.  Q: Why are antidepressants used to treat migraine? A: Antidepressants are typically used to treat people with depression. They may reduce migraine frequency by regulating chemical levels, such as serotonin, in the brain.  Q: What alternative therapies are used to treat migraine? A: The term "alternative therapies" is often used to describe treatments considered outside the scope of conventional Western medicine. Examples of alternative therapy include acupuncture, acupressure, and yoga. Another common alternative treatment is herbal therapy. Some herbs are believed to relieve headache pain. Always discuss alternative therapies with your caregiver before proceeding. Some herbal products contain arsenic and other toxins. TENSION HEADACHES Q: What is a tension-type headache? What causes it? How can I treat it? A: Tension-type headaches occur randomly. They are often the result of temporary stress, anxiety, fatigue, or anger. Symptoms include soreness in your temples, a tightening band-like sensation around your head (a "vice-like" ache). Symptoms can also include a pulling feeling, pressure sensations, and contracting head and neck muscles. The headache begins in your forehead, temples, or the back of your head and neck. Treatment for tension-type headache may include over-the-counter or prescription medications. Treatment may also include self-help techniques such as relaxation training and biofeedback. CLUSTER HEADACHES Q: What is a cluster headache? What causes it? How can I treat it? A: Cluster headache gets its name because the attacks come in groups. The pain arrives with little, if any, warning. It is usually on one side of the head. A tearing or bloodshot  eye and a runny nose on the same side of the headache may also accompany the pain. Cluster headaches are believed to be caused by chemical reactions in the brain. They have been described as the most severe and intense of any headache type. Treatment for cluster headache includes prescription medication and oxygen. SINUS HEADACHES Q: What is a sinus headache? What causes it? How can I treat it? A: When a cavity in the bones of the face and skull (a sinus) becomes inflamed, the inflammation will cause localized pain. This condition is usually the result of an allergic reaction, a tumor, or an infection. If your headache is caused by a sinus blockage, such as an infection, you will probably have a fever. An x-ray will confirm a sinus blockage. Your caregiver's treatment might include antibiotics for the infection, as well as antihistamines or decongestants.  REBOUND HEADACHES Q: What is a rebound headache? What causes it? How can I treat it? A: A pattern of taking acute headache medications too often can lead to a condition known as "rebound headache." A pattern of taking too much headache medication includes taking it more than 2 days per week or in excessive amounts. That means more than the label or a caregiver advises. With rebound headaches, your medications not only stop relieving pain, they actually begin to cause headaches. Doctors  treat rebound headache by tapering the medication that is being overused. Sometimes your caregiver will gradually substitute a different type of treatment or medication. Stopping may be a challenge. Regularly overusing a medication increases the potential for serious side effects. Consult a caregiver if you regularly use headache medications more than 2 days per week or more than the label advises. ADDITIONAL QUESTIONS AND ANSWERS Q: What is biofeedback? A: Biofeedback is a self-help treatment. Biofeedback uses special equipment to monitor your body's involuntary physical  responses. Biofeedback monitors:  Breathing.  Pulse.  Heart rate.  Temperature.  Muscle tension.  Brain activity. Biofeedback helps you refine and perfect your relaxation exercises. You learn to control the physical responses that are related to stress. Once the technique has been mastered, you do not need the equipment any more. Q: Are headaches hereditary? A: Four out of five (80%) of people that suffer report a family history of migraine. Scientists are not sure if this is genetic or a family predisposition. Despite the uncertainty, a child has a 50% chance of having migraine if one parent suffers. The child has a 75% chance if both parents suffer.  Q: Can children get headaches? A: By the time they reach high school, most young people have experienced some type of headache. Many safe and effective approaches or medications can prevent a headache from occurring or stop it after it has begun.  Q: What type of doctor should I see to diagnose and treat my headache? A: Start with your primary caregiver. Discuss his or her experience and approach to headaches. Discuss methods of classification, diagnosis, and treatment. Your caregiver may decide to recommend you to a headache specialist, depending upon your symptoms or other physical conditions. Having diabetes, allergies, etc., may require a more comprehensive and inclusive approach to your headache. The National Head[employer] Foundation will provide, upon request, a list of [facility] physician members in your state. Document Released: 09/14/2003 Document Revised: 09/16/2011 Document Reviewed: 02/22/2008 [facility] Patient Information 2014 [place].     Please return to emergency Department if you have chest pain, shortness of breath, palpitations, feel like you're going to pass out or do pass out, numbness or tingling or focal weakness, changes in your vision or hearing.

## 2013-09-12 NOTE — ED Provider Notes (Signed)
Chief Complaint   Chief Complaint  Patient presents with  . Dizziness  . Headache     History of Present Illness   [name] [name] is an 80 year old male with diabetes, hypertension, and hyperlipidemia. He's normally in good health. Today around 3:30 PM, shortly after eating his lunch, and he noted headache and dizziness. This lasted about 15-20 minutes. His daughter was with him at the time and noted some drooping of the left eye. The symptoms then went away completely. He was a move his extremities well and did not have any difficulty speaking, although the daughter noted his speech was very soft. He was tired and felt lethargic and did not feel like getting up off the couch. He did not have any diplopia or blurry vision. He denies any shortness of breath or chest pain. He denies any focal muscular weakness, numbness, tingling, or difficulty with his speech. He has not had any symptoms like this before. He has no history of a stroke or TIA.  Review of Systems   Other than as noted above, the patient denies any of the following symptoms: Systemic:  No fever, chills, photophobia, stiff neck. Eye:  No blurred vision or diplopia. ENT:  No nasal congestion or rhinorrhea.  No jaw claudication. Neuro:  No paresthesias, loss of consciousness, seizure activity, muscle weakness, trouble with coordination or gait, trouble speaking or swallowing. Psych:  No depression, anxiety or trouble sleeping.  [place]   Past medical history, family history, social history, meds, and allergies were reviewed.  He's allergic to ACE inhibitors. Current meds include losartan, Bystolic, glipizide, Lipitor, allopurinol, Onglyza, terazosin, and metformin. He has a history of hypertension, diabetes, gout, and hyperlipidemia.  Physical Examination     Vital signs:  BP 168/93  Pulse 89  Temp(Src) 98.7 F (37.1 C) (Oral)  Resp 16  SpO2 96% General:  Alert and oriented.  In no distress. Eye:  Lids and conjunctivas  normal.  PERRL,  Full EOMs.  Fundi benign with normal discs and vessels. ENT:  No cranial or facial tenderness to palpation.  TMs and canals clear.  Nasal mucosa was normal and uncongested without any drainage. No intra oral lesions, pharynx clear, mucous membranes moist, dentition normal. Neck:  Supple, full ROM, no tenderness to palpation.  No adenopathy or mass. No carotid bruit. Lungs: Clear to auscultation. Heart: Regular rhythm, no gallop or murmur. Neuro:  Alert and orented times 3.  Speech was clear, fluent, and appropriate.  Cranial nerves intact. No pronator drift, muscle strength normal. Finger to nose normal.  DTRs were 2+ and symmetrical.Station and gait were normal.  Romberg's sign was normal.  Able to perform tandem gait well. Psych:  Normal affect.  Labs   Results for orders placed during the hospital encounter of 09/12/13  GLUCOSE, CAPILLARY      Result Value Ref Range   Glucose-Capillary 195 (*) 70 - 99 mg/dL   EKG Results    Date: 09/12/2013  Rate: 86  Rhythm: normal sinus rhythm  QRS Axis: normal  Intervals: normal  ST/T Wave abnormalities: normal  Conduction Disutrbances:right bundle branch block  Narrative Interpretation: Normal sinus rhythm, right bundle branch block, otherwise normal EKG.  Old EKG Reviewed: none available  Assessment   The encounter diagnosis was TIA (transient ischemic attack).  Plan   The patient was transferred to the ED via shuttle in stable condition.  Medical Decision Making   80 year old gentleman with DM and HT had an episode this afternoon  of headache, dizziness, and drooping of left eyelid.  No other symptoms.  Episode lasted 15 to 20 minutes.  No symptoms now or previous history of similar symptoms in past.  No history of stroke.  Neuro exam is WNL now.  We are ordering an EKG and CBG and will send by shuttle.  My impression is that he had a TIA.          Harden Mo, MD 09/12/13 [phone]

## 2013-09-12 NOTE — ED Notes (Addendum)
Pt reports dizziness onset at 1530 today. Pt did have headache lasting 2min but denies headache now. Sent from [facility] to r/o stroke.

## 2014-04-18 ENCOUNTER — Ambulatory Visit (INDEPENDENT_AMBULATORY_CARE_PROVIDER_SITE_OTHER): Payer: Medicare Other | Admitting: Family Medicine

## 2014-04-18 VITALS — BP 130/85 | HR 102 | Temp 99.2°F | Resp 20 | Ht 67.5 in | Wt 212.0 lb

## 2014-04-18 DIAGNOSIS — E119 Type 2 diabetes mellitus without complications: Secondary | ICD-10-CM

## 2014-04-18 DIAGNOSIS — M10071 Idiopathic gout, right ankle and foot: Secondary | ICD-10-CM

## 2014-04-18 DIAGNOSIS — D509 Iron deficiency anemia, unspecified: Secondary | ICD-10-CM

## 2014-04-18 DIAGNOSIS — M25571 Pain in right ankle and joints of right foot: Secondary | ICD-10-CM

## 2014-04-18 LAB — BASIC METABOLIC PANEL
BUN: 14 mg/dL (ref 6–23)
CALCIUM: 8.6 mg/dL (ref 8.4–10.5)
CHLORIDE: 104 meq/L (ref 96–112)
CO2: 22 meq/L (ref 19–32)
Creat: 1.13 mg/dL (ref 0.50–1.35)
GLUCOSE: 231 mg/dL — AB (ref 70–99)
Potassium: 3.8 mEq/L (ref 3.5–5.3)
Sodium: 140 mEq/L (ref 135–145)

## 2014-04-18 LAB — POCT CBC
Granulocyte percent: 85.3 %G — AB (ref 37–80)
HEMATOCRIT: 30.4 % — AB (ref 43.5–53.7)
Hemoglobin: 9.5 g/dL — AB (ref 14.1–18.1)
LYMPH, POC: 0.9 (ref 0.6–3.4)
MCH: 24.9 pg — AB (ref 27–31.2)
MCHC: 31.3 g/dL — AB (ref 31.8–35.4)
MCV: 79.6 fL — AB (ref 80–97)
MID (cbc): 0.3 (ref 0–0.9)
MPV: 8.7 fL (ref 0–99.8)
PLATELET COUNT, POC: 229 10*3/uL (ref 142–424)
POC Granulocyte: 7.1 — AB (ref 2–6.9)
POC LYMPH %: 10.6 % (ref 10–50)
POC MID %: 4.1 %M (ref 0–12)
RBC: 3.81 M/uL — AB (ref 4.69–6.13)
RDW, POC: 19.3 %
WBC: 8.3 10*3/uL (ref 4.6–10.2)

## 2014-04-18 LAB — URIC ACID: Uric Acid, Serum: 5.3 mg/dL (ref 4.0–7.8)

## 2014-04-18 LAB — FERRITIN: Ferritin: 12 ng/mL — ABNORMAL LOW (ref 22–322)

## 2014-04-18 MED ORDER — INDOMETHACIN 50 MG PO CAPS: 50.0000 mg | ORAL_CAPSULE | Freq: Three times a day (TID) | ORAL | Status: DC

## 2014-04-18 MED ORDER — COLCHICINE 0.6 MG PO TABS: ORAL_TABLET | ORAL | Status: DC

## 2014-04-18 NOTE — Patient Instructions (Addendum)
Use the colchicine as directed for your gout flare- hold the atorvastatin for 1-2 days after taking this.  Use the indomethacin as needed for pain as well; just use this as needed.  I will be in touch with the rest of your labs asap.  Let me know if you are not better in a day or two

## 2014-04-18 NOTE — Progress Notes (Addendum)
[facility] [address] [place] [address] [phone]  Date:  04/18/2014   Name:  [name] [name]   DOB:  [birth year]   MRN:  [id number]  PCP:  No primary provider on file.    Chief Complaint: Foot Swelling   History of Present Illness:  [name] [name] is a 80 y.o. very pleasant male patient who presents with the following:  He is here today with right foot trouble for 3-4 days.  He does have a history of gout and this seems like gout to him.  He takes allopurinol for this daily- no recent change in his regimen.  There is no known injury   He has not noted any fever, chills or aches. Except for his foot he feels ok.   He usually uses a cane to walk around- he does have a walker at home  His PCP is Dr. Katherine Roan Here today with his daughter and other family.   He has a history of HTN and DM  There are no active problems to display for this patient.   Past Medical History  Diagnosis Date  . Hypertension   . Diabetes mellitus without complication   . Gout   . Hyperlipidemia     No past surgical history on file.  History  Substance Use Topics  . Smoking status: Former Research scientist (life sciences)  . Smokeless tobacco: Not on file  . Alcohol Use: No    No family history on file.  Allergies  Allergen Reactions  . Ace Inhibitors     Listed on allergy list in wallet; unk reason per pt  . Capoten [Captopril]     Listed on allergy list in wallet; unk reason per pt  . Glucophage [Metformin]     Listed on pt's med card under allergies, but pt currently on metformin  . Vasotec [Enalapril]     Listed on allergy list in pt's wallet; unk reason per pt    Medication list has been reviewed and updated.  Current Outpatient Prescriptions on File Prior to Visit  Medication Sig Dispense Refill  . allopurinol (ZYLOPRIM) 100 MG tablet Take 100 mg by mouth daily.      Marland Kitchen atorvastatin (LIPITOR) 20 MG tablet Take 20 mg by mouth daily.      Marland Kitchen glipiZIDE (GLUCOTROL XL) 10 MG  24 hr tablet Take 10 mg by mouth daily with breakfast.      . losartan (COZAAR) 100 MG tablet Take 100 mg by mouth every morning.       . metFORMIN (GLUCOPHAGE) 500 MG tablet Take 500-1,000 mg by mouth See admin instructions. 500mg  q AM, 1000mg  q evening with dinner      . nebivolol (BYSTOLIC) 5 MG tablet Take 5 mg by mouth See admin instructions. 4 days/week  Monday, Wednesday, Friday, and sunday      . saxagliptin HCl (ONGLYZA) 5 MG TABS tablet Take 5 mg by mouth daily.      Marland Kitchen terazosin (HYTRIN) 5 MG capsule Take 5 mg by mouth at bedtime.        No current facility-administered medications on file prior to visit.    Review of Systems:  As per HPI- otherwise negative.   Physical Examination: Filed Vitals:   04/18/14 1254  BP: 144/92  Pulse: 111  Temp: 99.2 F (37.3 C)  Resp: 20   Filed Vitals:   04/18/14 1254  Height: 5' 7.5" (1.715 m)  Weight: 212 lb (96.163 kg)   Body  mass index is 32.69 kg/(m^2). Ideal Body Weight: Weight in (lb) to have BMI = 25: 161.7  GEN: WDWN, NAD, Non-toxic, A & O x 3, overweight, looks warm.   HEENT: Atraumatic, Normocephalic. Neck supple. No masses, No LAD. Ears and Nose: No external deformity. CV: RRR, No M/G/R. No JVD. No thrill. No extra heart sounds. PULM: CTA B, no wheezes, crackles, rhonchi. No retractions. No resp. distress. No accessory muscle use. EXTR: No c/c/e NEURO favoring right, using a WC today PSYCH: Normally interactive. Conversant. Not depressed or anxious appearing.  Calm demeanor.  Right ankle: tender, warm and swollen at the lateral ankle.  Tenderness to light pressure consistent with gout.  Achilles is intact, foot with normal cap refill and sensation, normal pulses, no tenderness in the foot.   Results for orders placed in visit on 04/18/14  POCT CBC      Result Value Ref Range   WBC 8.3  4.6 - 10.2 K/uL   Lymph, poc 0.9  0.6 - 3.4   POC LYMPH PERCENT 10.6  10 - 50 %L   MID (cbc) 0.3  0 - 0.9   POC MID % 4.1  0 - 12  %M   POC Granulocyte 7.1 (*) 2 - 6.9   Granulocyte percent 85.3 (*) 37 - 80 %G   RBC 3.81 (*) 4.69 - 6.13 M/uL   Hemoglobin 9.5 (*) 14.1 - 18.1 g/dL   HCT, POC 30.4 (*) 43.5 - 53.7 %   MCV 79.6 (*) 80 - 97 fL   MCH, POC 24.9 (*) 27 - 31.2 pg   MCHC 31.3 (*) 31.8 - 35.4 g/dL   RDW, POC 19.3     Platelet Count, POC 229  142 - 424 K/uL   MPV 8.7  0 - 99.8 fL     Assessment and Plan: Right ankle pain - Plan: POCT CBC, Uric Acid  Microcytic anemia - Plan: Ferritin  Acute idiopathic gout of right foot - Plan: colchicine 0.6 MG tablet, indomethacin (INDOCIN) 50 MG capsule  Diabetes mellitus type 2 in nonobese - Plan: Basic metabolic panel  Suspect a gout attack.  Will treat with colchicine and cautiously use indomethacin as needed Close follow-up if not better. Encouraged him to use walker for safety.  Noted anemia- likely due to iron deficiency.  Check ferritin and also BMP for glucose control  Signed Lamar Blinks, MD  Called 10/15 to go over labs but no answer.  Will send a letter  Results for orders placed in visit on 04/18/14  URIC ACID      Result Value Ref Range   Uric Acid, Serum 5.3  4.0 - 7.8 mg/dL  FERRITIN      Result Value Ref Range   Ferritin 12 (*) 22 - 322 ng/mL  BASIC METABOLIC PANEL      Result Value Ref Range   Sodium 140  135 - 145 mEq/L   Potassium 3.8  3.5 - 5.3 mEq/L   Chloride 104  96 - 112 mEq/L   CO2 22  19 - 32 mEq/L   Glucose, Bld 231 (*) 70 - 99 mg/dL   BUN 14  6 - 23 mg/dL   Creat 1.13  0.50 - 1.35 mg/dL   Calcium 8.6  8.4 - 10.5 mg/dL  POCT CBC      Result Value Ref Range   WBC 8.3  4.6 - 10.2 K/uL   Lymph, poc 0.9  0.6 - 3.4   POC LYMPH PERCENT 10.6  10 - 50 %L   MID (cbc) 0.3  0 - 0.9   POC MID % 4.1  0 - 12 %M   POC Granulocyte 7.1 (*) 2 - 6.9   Granulocyte percent 85.3 (*) 37 - 80 %G   RBC 3.81 (*) 4.69 - 6.13 M/uL   Hemoglobin 9.5 (*) 14.1 - 18.1 g/dL   HCT, POC 30.4 (*) 43.5 - 53.7 %   MCV 79.6 (*) 80 - 97 fL   MCH, POC  24.9 (*) 27 - 31.2 pg   MCHC 31.3 (*) 31.8 - 35.4 g/dL   RDW, POC 19.3     Platelet Count, POC 229  142 - 424 K/uL   MPV 8.7  0 - 99.8 fL

## 2014-04-21 ENCOUNTER — Encounter: Payer: Self-pay | Admitting: Family Medicine

## 2015-11-30 ENCOUNTER — Ambulatory Visit (INDEPENDENT_AMBULATORY_CARE_PROVIDER_SITE_OTHER): Payer: Medicare Other

## 2015-11-30 ENCOUNTER — Ambulatory Visit (INDEPENDENT_AMBULATORY_CARE_PROVIDER_SITE_OTHER): Payer: Medicare Other | Admitting: Urgent Care

## 2015-11-30 VITALS — BP 138/70 | HR 119 | Temp 98.0°F | Resp 20 | Ht 67.5 in | Wt 198.0 lb

## 2015-11-30 DIAGNOSIS — R030 Elevated blood-pressure reading, without diagnosis of hypertension: Secondary | ICD-10-CM | POA: Diagnosis not present

## 2015-11-30 DIAGNOSIS — M10072 Idiopathic gout, left ankle and foot: Secondary | ICD-10-CM

## 2015-11-30 DIAGNOSIS — I10 Essential (primary) hypertension: Secondary | ICD-10-CM | POA: Diagnosis not present

## 2015-11-30 DIAGNOSIS — R609 Edema, unspecified: Secondary | ICD-10-CM | POA: Diagnosis not present

## 2015-11-30 DIAGNOSIS — M109 Gout, unspecified: Secondary | ICD-10-CM

## 2015-11-30 LAB — COMPLETE METABOLIC PANEL WITH GFR
ALT: 44 U/L (ref 9–46)
AST: 116 U/L — AB (ref 10–35)
Albumin: 3.2 g/dL — ABNORMAL LOW (ref 3.6–5.1)
Alkaline Phosphatase: 252 U/L — ABNORMAL HIGH (ref 40–115)
BILIRUBIN TOTAL: 0.8 mg/dL (ref 0.2–1.2)
BUN: 39 mg/dL — ABNORMAL HIGH (ref 7–25)
CALCIUM: 6.5 mg/dL — AB (ref 8.6–10.3)
CO2: 19 mmol/L — ABNORMAL LOW (ref 20–31)
Chloride: 103 mmol/L (ref 98–110)
Creat: 2.08 mg/dL — ABNORMAL HIGH (ref 0.70–1.11)
GFR, EST AFRICAN AMERICAN: 33 mL/min — AB (ref >=60)
GFR, Est Non African American: 28 mL/min — ABNORMAL LOW (ref >=60)
Glucose, Bld: 281 mg/dL — ABNORMAL HIGH (ref 65–99)
Potassium: 4.1 mmol/L (ref 3.5–5.3)
Sodium: 142 mmol/L (ref 135–146)
TOTAL PROTEIN: 6.3 g/dL (ref 6.1–8.1)

## 2015-11-30 LAB — POCT CBC
Granulocyte percent: 81.2 %G — AB (ref 37–80)
HEMATOCRIT: 26.4 % — AB (ref 43.5–53.7)
Hemoglobin: 9.1 g/dL — AB (ref 14.1–18.1)
LYMPH, POC: 1.8 (ref 0.6–3.4)
MCH, POC: 29.9 pg (ref 27–31.2)
MCHC: 34.4 g/dL (ref 31.8–35.4)
MCV: 87.1 fL (ref 80–97)
MID (cbc): 0.5 (ref 0–0.9)
MPV: 7.7 fL (ref 0–99.8)
POC GRANULOCYTE: 10 — AB (ref 2–6.9)
POC LYMPH %: 14.4 % (ref 10–50)
POC MID %: 4.4 %M (ref 0–12)
Platelet Count, POC: 223 10*3/uL (ref 142–424)
RBC: 3.03 M/uL — AB (ref 4.69–6.13)
RDW, POC: 16.5 %
WBC: 12.3 10*3/uL — AB (ref 4.6–10.2)

## 2015-11-30 LAB — URIC ACID: Uric Acid, Serum: 8.7 mg/dL — ABNORMAL HIGH (ref 4.0–7.8)

## 2015-11-30 MED ORDER — COLCHICINE 0.6 MG PO TABS: ORAL_TABLET | ORAL | Status: DC

## 2015-11-30 NOTE — Patient Instructions (Addendum)
- Stop taking allopurinol and discuss with your PCP if you should switch to colchicine for gout prophylaxis (prevention).  Gout Gout is an inflammatory arthritis caused by a buildup of uric acid crystals in the joints. Uric acid is a chemical that is normally present in the blood. When the level of uric acid in the blood is too high it can form crystals that deposit in your joints and tissues. This causes joint redness, soreness, and swelling (inflammation). Repeat attacks are common. Over time, uric acid crystals can form into masses (tophi) near a joint, destroying bone and causing disfigurement. Gout is treatable and often preventable. CAUSES  The disease begins with elevated levels of uric acid in the blood. Uric acid is produced by your body when it breaks down a naturally found substance called purines. Certain foods you eat, such as meats and fish, contain high amounts of purines. Causes of an elevated uric acid level include:  Being passed down from parent to child (heredity).  Diseases that cause increased uric acid production (such as obesity, psoriasis, and certain cancers).  Excessive alcohol use.  Diet, especially diets rich in meat and seafood.  Medicines, including certain cancer-fighting medicines (chemotherapy), water pills (diuretics), and aspirin.  Chronic kidney disease. The kidneys are no longer able to remove uric acid well.  Problems with metabolism. Conditions strongly associated with gout include:  Obesity.  High blood pressure.  High cholesterol.  Diabetes. Not everyone with elevated uric acid levels gets gout. It is not understood why some people get gout and others do not. Surgery, joint injury, and eating too much of certain foods are some of the factors that can lead to gout attacks. SYMPTOMS   An attack of gout comes on quickly. It causes intense pain with redness, swelling, and warmth in a joint.  Fever can occur.  Often, only one joint is involved.  Certain joints are more commonly involved:  Base of the big toe.  Knee.  Ankle.  Wrist.  Finger. Without treatment, an attack usually goes away in a few days to weeks. Between attacks, you usually will not have symptoms, which is different from many other forms of arthritis. DIAGNOSIS  Your caregiver will suspect gout based on your symptoms and exam. In some cases, tests may be recommended. The tests may include:  Blood tests.  Urine tests.  X-rays.  Joint fluid exam. This exam requires a needle to remove fluid from the joint (arthrocentesis). Using a microscope, gout is confirmed when uric acid crystals are seen in the joint fluid. TREATMENT  There are two phases to gout treatment: treating the sudden onset (acute) attack and preventing attacks (prophylaxis).  Treatment of an Acute Attack.  Medicines are used. These include anti-inflammatory medicines or steroid medicines.  An injection of steroid medicine into the affected joint is sometimes necessary.  The painful joint is rested. Movement can worsen the arthritis.  You may use warm or cold treatments on painful joints, depending which works best for you.  Treatment to Prevent Attacks.  If you suffer from frequent gout attacks, your caregiver may advise preventive medicine. These medicines are started after the acute attack subsides. These medicines either help your kidneys eliminate uric acid from your body or decrease your uric acid production. You may need to stay on these medicines for a very long time.  The early phase of treatment with preventive medicine can be associated with an increase in acute gout attacks. For this reason, during the first few months  of treatment, your caregiver may also advise you to take medicines usually used for acute gout treatment. Be sure you understand your caregiver's directions. Your caregiver may make several adjustments to your medicine dose before these medicines are  effective.  Discuss dietary treatment with your caregiver or dietitian. Alcohol and drinks high in sugar and fructose and foods such as meat, poultry, and seafood can increase uric acid levels. Your caregiver or dietitian can advise you on drinks and foods that should be limited. HOME CARE INSTRUCTIONS   Do not take aspirin to relieve pain. This raises uric acid levels.  Only take over-the-counter or prescription medicines for pain, discomfort, or fever as directed by your caregiver.  Rest the joint as much as possible. When in bed, keep sheets and blankets off painful areas.  Keep the affected joint raised (elevated).  Apply warm or cold treatments to painful joints. Use of warm or cold treatments depends on which works best for you.  Use crutches if the painful joint is in your leg.  Drink enough fluids to keep your urine clear or pale yellow. This helps your body get rid of uric acid. Limit alcohol, sugary drinks, and fructose drinks.  Follow your dietary instructions. Pay careful attention to the amount of protein you eat. Your daily diet should emphasize fruits, vegetables, whole grains, and fat-free or low-fat milk products. Discuss the use of coffee, vitamin C, and cherries with your caregiver or dietitian. These may be helpful in lowering uric acid levels.  Maintain a healthy body weight. SEEK MEDICAL CARE IF:   You develop diarrhea, vomiting, or any side effects from medicines.  You do not feel better in 24 hours, or you are getting worse. SEEK IMMEDIATE MEDICAL CARE IF:   Your joint becomes suddenly more tender, and you have chills or a fever. MAKE SURE YOU:   Understand these instructions.  Will watch your condition.  Will get help right away if you are not doing well or get worse.   This information is not intended to replace advice given to you by your health care provider. Make sure you discuss any questions you have with your health care provider.   Document  Released: 06/21/2000 Document Revised: 07/15/2014 Document Reviewed: 02/05/2012 Elsevier Interactive Patient Education 2016 Elsevier Inc.    Edema Edema is an abnormal buildup of fluids in your bodytissues. Edema is somewhatdependent on gravity to pull the fluid to the lowest place in your body. That makes the condition more common in the legs and thighs (lower extremities). Painless swelling of the feet and ankles is common and becomes more likely as you get older. It is also common in looser tissues, like around your eyes.  When the affected area is squeezed, the fluid may move out of that spot and leave a dent for a few moments. This dent is called pitting.  CAUSES  There are many possible causes of edema. Eating too much salt and being on your feet or sitting for a long time can cause edema in your legs and ankles. Hot weather may make edema worse. Common medical causes of edema include:  Heart failure.  Liver disease.  Kidney disease.  Weak blood vessels in your legs.  Cancer.  An injury.  Pregnancy.  Some medications.  Obesity. SYMPTOMS  Edema is usually painless.Your skin may look swollen or shiny.  DIAGNOSIS  Your health care provider may be able to diagnose edema by asking about your medical history and doing a physical exam.  You may need to have tests such as X-rays, an electrocardiogram, or blood tests to check for medical conditions that may cause edema.  TREATMENT  Edema treatment depends on the cause. If you have heart, liver, or kidney disease, you need the treatment appropriate for these conditions. General treatment may include:  Elevation of the affected body part above the level of your heart.  Compression of the affected body part. Pressure from elastic bandages or support stockings squeezes the tissues and forces fluid back into the blood vessels. This keeps fluid from entering the tissues.  Restriction of fluid and salt intake.  Use of a water pill  (diuretic). These medications are appropriate only for some types of edema. They pull fluid out of your body and make you urinate more often. This gets rid of fluid and reduces swelling, but diuretics can have side effects. Only use diuretics as directed by your health care provider. HOME CARE INSTRUCTIONS   Keep the affected body part above the level of your heart when you are lying down.   Do not sit still or stand for prolonged periods.   Do not put anything directly under your knees when lying down.  Do not wear constricting clothing or garters on your upper legs.   Exercise your legs to work the fluid back into your blood vessels. This may help the swelling go down.   Wear elastic bandages or support stockings to reduce ankle swelling as directed by your health care provider.   Eat a low-salt diet to reduce fluid if your health care provider recommends it.   Only take medicines as directed by your health care provider. SEEK MEDICAL CARE IF:   Your edema is not responding to treatment.  You have heart, liver, or kidney disease and notice symptoms of edema.  You have edema in your legs that does not improve after elevating them.   You have sudden and unexplained weight gain. SEEK IMMEDIATE MEDICAL CARE IF:   You develop shortness of breath or chest pain.   You cannot breathe when you lie down.  You develop pain, redness, or warmth in the swollen areas.   You have heart, liver, or kidney disease and suddenly get edema.  You have a fever and your symptoms suddenly get worse. MAKE SURE YOU:   Understand these instructions.  Will watch your condition.  Will get help right away if you are not doing well or get worse.   This information is not intended to replace advice given to you by your health care provider. Make sure you discuss any questions you have with your health care provider.   Document Released: 06/24/2005 Document Revised: 07/15/2014 Document  Reviewed: 04/16/2013 Elsevier Interactive Patient Education 2016 [employer].     IF you received an x-ray today, you will receive an invoice from [facility]. Please contact [facility] at [phone] with questions or concerns regarding your invoice.   IF you received labwork today, you will receive an invoice from [employer]. Please contact [facility] at [phone] with questions or concerns regarding your invoice.   Our billing staff will not be able to assist you with questions regarding bills from these companies.  You will be contacted with the lab results as soon as they are available. The fastest way to get your results is to activate your My Chart account. Instructions are located on the last page of this paperwork. If you have not heard from [place] regarding the results in 2  weeks, please contact this office.

## 2015-11-30 NOTE — Progress Notes (Addendum)
MRN: [id number] DOB: [birth year]  Subjective:   [name] [name] is a 82 y.o. male with pmh of gout, HTN presenting for chief complaint of Foot Swelling  Reports 2 day history of swollen, red, painful left great toe. Also reports swelling of his left lower leg, mild left ankle pain. He started taking allopurinol for this without any relief. Of note, patient states that he thinks his problem started when his foot got caught getting out of the tub. Denies fall but has had pain since then. Patient manages his BP with losartan, bystolic. His next appointment with his PCP is on 12/11/2015. Denies lightheadedness, dizziness, chronic headache, double vision, chest pain, shortness of breath, heart racing, palpitations, nausea, vomiting, abdominal pain, hematuria, lower leg swelling.   Lillie has a current medication list which includes the following prescription(s): allopurinol, atorvastatin, glipizide, losartan, metformin, nebivolol, saxagliptin hcl, terazosin, and colchicine. Also is allergic to ace inhibitors; capoten; glucophage; and vasotec.  [name]  has a past medical history of Hypertension; Diabetes mellitus without complication (Reform); Gout; and Hyperlipidemia. Also  has no past surgical history on file.  Objective:   Vitals: BP 138/70 mmHg  Pulse 119  Temp(Src) 98 F (36.7 C) (Oral)  Resp 20  Ht 5' 7.5" (1.715 m)  Wt 198 lb (89.812 kg)  BMI 30.54 kg/m2  SpO2 97%  Physical Exam  Constitutional: He is oriented to person, place, and time. He appears well-developed and well-nourished.  HENT:  Mouth/Throat: Oropharynx is clear and moist.  Eyes: Pupils are equal, round, and reactive to light. No scleral icterus.  Neck: Normal range of motion. Neck supple.  Cardiovascular: Normal rate, regular rhythm and intact distal pulses.  Exam reveals no gallop and no friction rub.   No murmur heard. Pulmonary/Chest: No respiratory distress. He has no wheezes. He has no rales.  Musculoskeletal: He  exhibits edema (trace edema up to distal calf).       Left ankle: He exhibits swelling. He exhibits normal range of motion, no ecchymosis, no deformity, no laceration and normal pulse. Tenderness. Lateral malleolus and medial malleolus tenderness found. No AITFL, no CF ligament, no posterior TFL, no head of 5th metatarsal and no proximal fibula tenderness found.       Left foot: There is decreased range of motion (flexion), tenderness (with associated erythema of 1st MTP), bony tenderness and swelling (of 1st MTP). There is normal capillary refill, no crepitus, no deformity and no laceration.       Feet:  Neurological: He is alert and oriented to person, place, and time.  Skin: Skin is warm and dry.   Dg Foot 2 Views Left  11/30/2015  CLINICAL DATA:  Swollen, painful left great toe EXAM: LEFT FOOT - 2 VIEW COMPARISON:  None. FINDINGS: Tarsal-metatarsal alignment is normal. No fracture is seen. No definite erosion is noted. Joint spaces are relatively well preserved for age. There is faint opacity within the soft tissues adjacent to the left first metatarsal head which could represent faint calcification possibly indicative of gout. There is pes planus present. There are degenerative changes in the midfoot with degenerative calcaneal spurs also noted. IMPRESSION: 1. No acute abnormality. 2. Questionable faint calcification in the soft tissues adjacent to the left first MTP joint could indicate gout but no erosions are noted. 3. Pes planus with degenerative changes in the mid and hindfoot. Electronically Signed   By: Ivar Drape M.D.   On: 11/30/2015 15:12     Results for orders placed or  performed in visit on 11/30/15 (from the past 24 hour(s))  POCT CBC     Status: Abnormal   Collection Time: 11/30/15  2:57 PM  Result Value Ref Range   WBC 12.3 (A) 4.6 - 10.2 K/uL   Lymph, poc 1.8 0.6 - 3.4   POC LYMPH PERCENT 14.4 10 - 50 %L   MID (cbc) 0.5 0 - 0.9   POC MID % 4.4 0 - 12 %M   POC Granulocyte  10.0 (A) 2 - 6.9   Granulocyte percent 81.2 (A) 37 - 80 %G   RBC 3.03 (A) 4.69 - 6.13 M/uL   Hemoglobin 9.1 (A) 14.1 - 18.1 g/dL   HCT, POC 26.4 (A) 43.5 - 53.7 %   MCV 87.1 80 - 97 fL   MCH, POC 29.9 27 - 31.2 pg   MCHC 34.4 31.8 - 35.4 g/dL   RDW, POC 16.5 %   Platelet Count, POC 223 142 - 424 K/uL   MPV 7.7 0 - 99.8 fL   Assessment and Plan :   1. Acute gout of left foot, unspecified cause - Will manage acute gout attack with colchicine, labs pending.   2. Essential hypertension 3. Elevated blood pressure reading 4. Peripheral edema - Patient is medically stable, I advised he get in touch with his PCP for continued management of his blood pressure. I will hold off on starting a diuretic due to his ongoing gout attack. Also I will not start a CCB due to risk of worsening peripheral edema. Patient agreed to contact PCP for management of his BP.  Jaynee Eagles, PA-C [facility] [phone] 11/30/2015 2:30 PM

## 2015-12-01 ENCOUNTER — Emergency Department (HOSPITAL_COMMUNITY): Payer: Medicare Other

## 2015-12-01 ENCOUNTER — Inpatient Hospital Stay (HOSPITAL_COMMUNITY)
Admission: EM | Admit: 2015-12-01 | Discharge: 2015-12-04 | DRG: 378 | Disposition: A | Discharge status: Home / Self Care | Payer: Medicare Other | Attending: Internal Medicine | Admitting: Internal Medicine

## 2015-12-01 ENCOUNTER — Encounter (HOSPITAL_COMMUNITY): Payer: Self-pay | Admitting: Emergency Medicine

## 2015-12-01 DIAGNOSIS — E861 Hypovolemia: Secondary | ICD-10-CM | POA: Diagnosis present

## 2015-12-01 DIAGNOSIS — M109 Gout, unspecified: Secondary | ICD-10-CM | POA: Diagnosis present

## 2015-12-01 DIAGNOSIS — Z7982 Long term (current) use of aspirin: Secondary | ICD-10-CM

## 2015-12-01 DIAGNOSIS — K922 Gastrointestinal hemorrhage, unspecified: Secondary | ICD-10-CM | POA: Diagnosis present

## 2015-12-01 DIAGNOSIS — K921 Melena: Secondary | ICD-10-CM | POA: Insufficient documentation

## 2015-12-01 DIAGNOSIS — K5731 Diverticulosis of large intestine without perforation or abscess with bleeding: Secondary | ICD-10-CM | POA: Diagnosis present

## 2015-12-01 DIAGNOSIS — E119 Type 2 diabetes mellitus without complications: Secondary | ICD-10-CM | POA: Diagnosis not present

## 2015-12-01 DIAGNOSIS — E785 Hyperlipidemia, unspecified: Secondary | ICD-10-CM | POA: Diagnosis present

## 2015-12-01 DIAGNOSIS — K2970 Gastritis, unspecified, without bleeding: Secondary | ICD-10-CM | POA: Diagnosis present

## 2015-12-01 DIAGNOSIS — D62 Acute posthemorrhagic anemia: Secondary | ICD-10-CM | POA: Diagnosis present

## 2015-12-01 DIAGNOSIS — R531 Weakness: Secondary | ICD-10-CM

## 2015-12-01 DIAGNOSIS — K31811 Angiodysplasia of stomach and duodenum with bleeding: Secondary | ICD-10-CM | POA: Diagnosis present

## 2015-12-01 DIAGNOSIS — R195 Other fecal abnormalities: Secondary | ICD-10-CM | POA: Diagnosis not present

## 2015-12-01 DIAGNOSIS — I10 Essential (primary) hypertension: Secondary | ICD-10-CM | POA: Diagnosis not present

## 2015-12-01 DIAGNOSIS — N179 Acute kidney failure, unspecified: Secondary | ICD-10-CM | POA: Diagnosis present

## 2015-12-01 DIAGNOSIS — M1000 Idiopathic gout, unspecified site: Secondary | ICD-10-CM | POA: Diagnosis not present

## 2015-12-01 DIAGNOSIS — D649 Anemia, unspecified: Secondary | ICD-10-CM | POA: Insufficient documentation

## 2015-12-01 DIAGNOSIS — D120 Benign neoplasm of cecum: Secondary | ICD-10-CM | POA: Diagnosis present

## 2015-12-01 DIAGNOSIS — E1122 Type 2 diabetes mellitus with diabetic chronic kidney disease: Secondary | ICD-10-CM | POA: Diagnosis present

## 2015-12-01 DIAGNOSIS — Z87891 Personal history of nicotine dependence: Secondary | ICD-10-CM | POA: Diagnosis not present

## 2015-12-01 DIAGNOSIS — K5730 Diverticulosis of large intestine without perforation or abscess without bleeding: Secondary | ICD-10-CM | POA: Diagnosis not present

## 2015-12-01 DIAGNOSIS — Z888 Allergy status to other drugs, medicaments and biological substances status: Secondary | ICD-10-CM | POA: Diagnosis not present

## 2015-12-01 DIAGNOSIS — I129 Hypertensive chronic kidney disease with stage 1 through stage 4 chronic kidney disease, or unspecified chronic kidney disease: Secondary | ICD-10-CM | POA: Diagnosis present

## 2015-12-01 DIAGNOSIS — K31819 Angiodysplasia of stomach and duodenum without bleeding: Secondary | ICD-10-CM | POA: Diagnosis not present

## 2015-12-01 DIAGNOSIS — N183 Chronic kidney disease, stage 3 (moderate): Secondary | ICD-10-CM | POA: Diagnosis present

## 2015-12-01 DIAGNOSIS — Z7984 Long term (current) use of oral hypoglycemic drugs: Secondary | ICD-10-CM | POA: Diagnosis not present

## 2015-12-01 DIAGNOSIS — E876 Hypokalemia: Secondary | ICD-10-CM | POA: Diagnosis present

## 2015-12-01 DIAGNOSIS — Z79899 Other long term (current) drug therapy: Secondary | ICD-10-CM

## 2015-12-01 DIAGNOSIS — Z86010 Personal history of colonic polyps: Secondary | ICD-10-CM | POA: Diagnosis not present

## 2015-12-01 DIAGNOSIS — D508 Other iron deficiency anemias: Secondary | ICD-10-CM

## 2015-12-01 LAB — CBC WITH DIFFERENTIAL/PLATELET
Basophils Absolute: 0 10*3/uL (ref 0.0–0.1)
Basophils Relative: 0 %
Eosinophils Absolute: 0 10*3/uL (ref 0.0–0.7)
Eosinophils Relative: 0 %
HEMATOCRIT: 24.1 % — AB (ref 39.0–52.0)
HEMOGLOBIN: 8 g/dL — AB (ref 13.0–17.0)
LYMPHS ABS: 1 10*3/uL (ref 0.7–4.0)
LYMPHS PCT: 10 %
MCH: 29.3 pg (ref 26.0–34.0)
MCHC: 33.2 g/dL (ref 30.0–36.0)
MCV: 88.3 fL (ref 78.0–100.0)
MONOS PCT: 9 %
Monocytes Absolute: 0.9 10*3/uL (ref 0.1–1.0)
NEUTROS ABS: 8.1 10*3/uL — AB (ref 1.7–7.7)
NEUTROS PCT: 81 %
Platelets: 198 10*3/uL (ref 150–400)
RBC: 2.73 MIL/uL — AB (ref 4.22–5.81)
RDW: 15.7 % — ABNORMAL HIGH (ref 11.5–15.5)
WBC: 10.1 10*3/uL (ref 4.0–10.5)

## 2015-12-01 LAB — COMPREHENSIVE METABOLIC PANEL
ALT: 43 U/L (ref 17–63)
ANION GAP: 14 (ref 5–15)
AST: 112 U/L — ABNORMAL HIGH (ref 15–41)
Albumin: 2.7 g/dL — ABNORMAL LOW (ref 3.5–5.0)
Alkaline Phosphatase: 208 U/L — ABNORMAL HIGH (ref 38–126)
BUN: 46 mg/dL — ABNORMAL HIGH (ref 6–20)
CHLORIDE: 103 mmol/L (ref 101–111)
CO2: 23 mmol/L (ref 22–32)
Calcium: 6.2 mg/dL — CL (ref 8.9–10.3)
Creatinine, Ser: 2.23 mg/dL — ABNORMAL HIGH (ref 0.61–1.24)
GFR calc non Af Amer: 25 mL/min — ABNORMAL LOW (ref >60)
GFR, EST AFRICAN AMERICAN: 29 mL/min — AB (ref >60)
Glucose, Bld: 237 mg/dL — ABNORMAL HIGH (ref 65–99)
POTASSIUM: 3.3 mmol/L — AB (ref 3.5–5.1)
SODIUM: 140 mmol/L (ref 135–145)
Total Bilirubin: 0.7 mg/dL (ref 0.3–1.2)
Total Protein: 6.1 g/dL — ABNORMAL LOW (ref 6.5–8.1)

## 2015-12-01 LAB — URINALYSIS, ROUTINE W REFLEX MICROSCOPIC
Glucose, UA: NEGATIVE mg/dL
HGB URINE DIPSTICK: NEGATIVE
Ketones, ur: NEGATIVE mg/dL
Leukocytes, UA: NEGATIVE
Nitrite: NEGATIVE
PH: 5 (ref 5.0–8.0)
Protein, ur: NEGATIVE mg/dL
SPECIFIC GRAVITY, URINE: 1.019 (ref 1.005–1.030)

## 2015-12-01 LAB — PROTIME-INR
INR: 1.2 (ref 0.00–1.49)
Prothrombin Time: 15.4 seconds — ABNORMAL HIGH (ref 11.6–15.2)

## 2015-12-01 LAB — APTT: aPTT: 32 seconds (ref 24–37)

## 2015-12-01 LAB — POC OCCULT BLOOD, ED: FECAL OCCULT BLD: POSITIVE — AB

## 2015-12-01 LAB — RETICULOCYTES
RBC.: 2.71 MIL/uL — AB (ref 4.22–5.81)
RETIC CT PCT: 2.5 % (ref 0.4–3.1)
Retic Count, Absolute: 67.8 10*3/uL (ref 19.0–186.0)

## 2015-12-01 LAB — MAGNESIUM: MAGNESIUM: 0.9 mg/dL — AB (ref 1.7–2.4)

## 2015-12-01 MED ORDER — TERAZOSIN HCL 5 MG PO CAPS
5.0000 mg | ORAL_CAPSULE | Freq: Every day | ORAL | Status: DC
  Administered 2015-12-02 – 2015-12-03 (×3): 5 mg via ORAL
  Filled 2015-12-01 (×4): qty 1

## 2015-12-01 MED ORDER — LATANOPROST 0.005 % OP SOLN
1.0000 [drp] | Freq: Every day | OPHTHALMIC | Status: DC
  Administered 2015-12-02 – 2015-12-03 (×3): 1 [drp] via OPHTHALMIC
  Filled 2015-12-01: qty 2.5

## 2015-12-01 MED ORDER — PANTOPRAZOLE SODIUM 40 MG IV SOLR: 40.0000 mg | Freq: Two times a day (BID) | INTRAVENOUS | Status: DC

## 2015-12-01 MED ORDER — SODIUM CHLORIDE 0.9 % IV BOLUS (SEPSIS)
500.0000 mL | Freq: Once | INTRAVENOUS | Status: AC
  Administered 2015-12-01: 500 mL via INTRAVENOUS

## 2015-12-01 MED ORDER — INSULIN ASPART 100 UNIT/ML ~~LOC~~ SOLN
0.0000 [IU] | Freq: Three times a day (TID) | SUBCUTANEOUS | Status: DC
  Administered 2015-12-02: 3 [IU] via SUBCUTANEOUS

## 2015-12-01 MED ORDER — SODIUM CHLORIDE 0.9 % IV SOLN
80.0000 mg | Freq: Once | INTRAVENOUS | Status: AC
  Administered 2015-12-01: 80 mg via INTRAVENOUS
  Filled 2015-12-01: qty 40

## 2015-12-01 MED ORDER — ACETAMINOPHEN 325 MG PO TABS: 650.0000 mg | ORAL_TABLET | Freq: Four times a day (QID) | ORAL | Status: DC | PRN

## 2015-12-01 MED ORDER — DARIFENACIN HYDROBROMIDE ER 7.5 MG PO TB24
7.5000 mg | ORAL_TABLET | Freq: Every day | ORAL | Status: DC
  Administered 2015-12-02 – 2015-12-04 (×3): 7.5 mg via ORAL
  Filled 2015-12-01 (×3): qty 1

## 2015-12-01 MED ORDER — ONDANSETRON HCL 4 MG PO TABS: 4.0000 mg | ORAL_TABLET | Freq: Four times a day (QID) | ORAL | Status: DC | PRN

## 2015-12-01 MED ORDER — SODIUM CHLORIDE 0.9 % IV SOLN
8.0000 mg/h | INTRAVENOUS | Status: DC
  Administered 2015-12-02: 8 mg/h via INTRAVENOUS
  Filled 2015-12-01 (×2): qty 80

## 2015-12-01 MED ORDER — PREDNISONE 20 MG PO TABS
40.0000 mg | ORAL_TABLET | Freq: Every day | ORAL | Status: DC
  Administered 2015-12-02: 40 mg via ORAL
  Filled 2015-12-01: qty 2

## 2015-12-01 MED ORDER — HYDROCODONE-ACETAMINOPHEN 5-325 MG PO TABS: 1.0000 | ORAL_TABLET | ORAL | Status: DC | PRN

## 2015-12-01 MED ORDER — ACETAMINOPHEN 650 MG RE SUPP: 650.0000 mg | Freq: Four times a day (QID) | RECTAL | Status: DC | PRN

## 2015-12-01 MED ORDER — SODIUM CHLORIDE 0.9 % IV SOLN
1.0000 g | Freq: Once | INTRAVENOUS | Status: AC
  Administered 2015-12-01: 1 g via INTRAVENOUS
  Filled 2015-12-01: qty 10

## 2015-12-01 MED ORDER — ONDANSETRON HCL 4 MG/2ML IJ SOLN: 4.0000 mg | Freq: Four times a day (QID) | INTRAMUSCULAR | Status: DC | PRN

## 2015-12-01 MED ORDER — SODIUM CHLORIDE 0.9 % IV SOLN
Freq: Once | INTRAVENOUS | Status: AC
  Administered 2015-12-02: 06:00:00 via INTRAVENOUS

## 2015-12-01 MED ORDER — ATORVASTATIN CALCIUM 10 MG PO TABS
20.0000 mg | ORAL_TABLET | Freq: Every day | ORAL | Status: DC
  Administered 2015-12-02 – 2015-12-04 (×3): 20 mg via ORAL
  Filled 2015-12-01 (×3): qty 2

## 2015-12-01 MED ORDER — SODIUM CHLORIDE 0.9 % IV SOLN
INTRAVENOUS | Status: AC
  Administered 2015-12-02: 01:00:00 via INTRAVENOUS

## 2015-12-01 MED ORDER — SODIUM CHLORIDE 0.9% FLUSH
3.0000 mL | Freq: Two times a day (BID) | INTRAVENOUS | Status: DC
  Administered 2015-12-02 – 2015-12-04 (×5): 3 mL via INTRAVENOUS

## 2015-12-01 MED ORDER — MAGNESIUM SULFATE 2 GM/50ML IV SOLN
2.0000 g | Freq: Once | INTRAVENOUS | Status: AC
  Administered 2015-12-02: 2 g via INTRAVENOUS
  Filled 2015-12-01: qty 50

## 2015-12-01 MED ORDER — POTASSIUM CHLORIDE 10 MEQ/100ML IV SOLN
10.0000 meq | INTRAVENOUS | Status: AC
  Administered 2015-12-02 (×4): 10 meq via INTRAVENOUS
  Filled 2015-12-01 (×3): qty 100

## 2015-12-01 NOTE — H&P (Signed)
History and Physical    [name] [name] [id number] DOB: 04/30/1929 DOA: 12/01/2015  PCP: No PCP Per Patient   Patient coming from: Home  Chief Complaint: Generalized weakness, fatigue, melena   HPI: [name] [name] is a 82 y.o. male with medical history significant for hypertension, type 2 diabetes mellitus, hyperlipidemia, gout, and pan-diverticulosis with lower GI bleed in 2006 who now presents to the ED with 2 weeks of melena and loss of appetite, and 2-3 days of generalized weakness and fatigue. Patient reports pain in his usual state of health until approximately 2 weeks, he noticed that his stools had become very dark. Around that same time, he developed a loss of appetite, reporting that food no longer has much of the taste. His condition persisted largely unchanged until 2-3 days ago when he developed an acute gout flare in the left first MTP as well as generalized weakness and fatigue. He was evaluated for the gout flare at an urgent care and treated effectively with colchicine. Patient denies any abdominal pain, nausea, vomiting, or diarrhea. Aside from the recent course of colchicine, he denies any new medications or changes in his medications. He takes a daily aspirin 81 mg, but no anticoagulation. He denies any headaches, focal numbness or weakness, or loss of coordination. Of note, the patient was admitted for a diverticular bleed in 2006, found to have pan-diverticulosis on colonoscopy, and a surgical consultant felt that he may at some point require colectomy. There is a history of a 12 cm tubulous adenoma removed endoscopically in 2006 with no atypia on pathology report.  ED Course: Upon arrival to the ED, patient is found to beafebrile, saturating well on room air, tachycardic in the low 100s, and with vitals otherwise stable. CMP features a hypokalemia, serum creatinine of 2.23, up from an apparent baseline of just over 1. Chemistry panels also notable for albumin of 2.7, calcium  of 6.2, glucose of 237, and magnesium of 0.9. CBC features a hemoglobin of 8.0, down from 9.1 act in urgent care 2 days prior. DRE produces dark stool which is FOBT positive. Type and screen was performed and a 500 cc normal saline bolus was given. Patient has remained hemodynamically stable in the emergency department and will be admitted to the hospital for ongoing evaluation and management of acute renal failure with widespread electrolyte derangements and symptomatic anemia.    Review of Systems:  All other systems reviewed and apart from HPI, are negative.  Past Medical History  Diagnosis Date  . Hypertension   . Diabetes mellitus without complication ([place])   . Gout   . Hyperlipidemia     History reviewed. No pertinent past surgical history.   reports that he has quit smoking. He does not have any smokeless tobacco history on file. He reports that he does not drink alcohol or use illicit drugs.  Allergies  Allergen Reactions  . Ace Inhibitors     Listed on allergy list in wallet; unk reason per pt  . Capoten [Captopril]     Listed on allergy list in wallet; unk reason per pt  . Glucophage [Metformin]     Listed on pt's med card under allergies, but pt currently on metformin  . Vasotec [Enalapril]     Listed on allergy list in pt's wallet; unk reason per pt    History reviewed. No pertinent family history.   Prior to Admission medications   Medication Sig Start Date End Date Taking? Authorizing Provider  atorvastatin (LIPITOR) 20  MG tablet Take 20 mg by mouth daily.   Yes Historical Provider, MD  colchicine 0.6 MG tablet Take 2 pills now, then 1 pill in an hour. Wait 3 days before repeating if your gout attack continues. 11/30/15  Yes Jaynee Eagles, PA-C  glipiZIDE (GLUCOTROL XL) 10 MG 24 hr tablet Take 10 mg by mouth daily with breakfast.   Yes Historical Provider, MD  latanoprost (XALATAN) 0.005 % ophthalmic solution Place 1 drop into both eyes at bedtime. 11/13/15  Yes  Historical Provider, MD  losartan (COZAAR) 100 MG tablet Take 100 mg by mouth every morning.    Yes Historical Provider, MD  metFORMIN (GLUCOPHAGE) 500 MG tablet Take 1,000 mg by mouth every evening.    Yes Historical Provider, MD  PRESCRIPTION MEDICATION Sample of Janumet- unsure of strength.   Yes Historical Provider, MD  saxagliptin HCl (ONGLYZA) 5 MG TABS tablet Take 5 mg by mouth daily.   Yes Historical Provider, MD  terazosin (HYTRIN) 5 MG capsule Take 5 mg by mouth at bedtime.    Yes Historical Provider, MD  VESICARE 5 MG tablet Take 5 mg by mouth daily. 11/27/15  Yes Historical Provider, MD    Physical Exam: Filed Vitals:   12/01/15 2012 12/01/15 2149  BP: 128/69 131/74  Pulse: 105 106  Temp: 99.1 F (37.3 C)   TempSrc: Oral   Resp: 24 25  Height: 5\' 8"  (1.727 m)   Weight: 90.719 kg (200 lb)   SpO2: 98% 99%      Constitutional: NAD, calm, comfortable Eyes: PERTLA, no scleral icterus or injection, conjunctival pallor  ENMT: Mucous membranes are moist. Posterior pharynx clear of any exudate or lesions.   Neck: normal, supple, no masses, no thyromegaly Respiratory: clear to auscultation bilaterally, no wheezing, no crackles. Normal respiratory effort.   Cardiovascular: S1 & S2 heard, regular rate and rhythm, soft systolic murmur at LSB. No significant JVD. Abdomen: No distension, no tenderness, no masses palpated. Bowel sounds normal.  Musculoskeletal: no clubbing / cyanosis. No joint deformity upper and lower extremities. Normal muscle tone.  Skin: no significant rashes, lesions, ulcers. Warm, dry, well-perfused. Neurologic: CN 2-12 grossly intact. Sensation intact, DTR normal. Strength 5/5 in all 4 limbs.  Psychiatric: Normal judgment and insight. Alert and oriented x 3. Normal mood and affect.     Labs on Admission: I have personally reviewed following labs and imaging studies  CBC:  Recent Labs Lab 11/30/15 1457 12/01/15 2056  WBC 12.3* 10.1  NEUTROABS  --   8.1*  HGB 9.1* 8.0*  HCT 26.4* 24.1*  MCV 87.1 88.3  PLT  --  99991111   Basic Metabolic Panel:  Recent Labs Lab 11/30/15 1448 12/01/15 2056 12/01/15 2102  NA 142 140  --   K 4.1 3.3*  --   CL 103 103  --   CO2 19* 23  --   GLUCOSE 281* 237*  --   BUN 39* 46*  --   CREATININE 2.08* 2.23*  --   CALCIUM 6.5* 6.2*  --   MG  --   --  0.9*   GFR: Estimated Creatinine Clearance: 27 mL/min (by C-G formula based on Cr of 2.23). Liver Function Tests:  Recent Labs Lab 11/30/15 1448 12/01/15 2056  AST 116* 112*  ALT 44 43  ALKPHOS 252* 208*  BILITOT 0.8 0.7  PROT 6.3 6.1*  ALBUMIN 3.2* 2.7*   No results for input(s): LIPASE, AMYLASE in the last 168 hours. No results for input(s): AMMONIA in  the last 168 hours. Coagulation Profile: No results for input(s): INR, PROTIME in the last 168 hours. Cardiac Enzymes: No results for input(s): CKTOTAL, CKMB, CKMBINDEX, TROPONINI in the last 168 hours. BNP (last 3 results) No results for input(s): PROBNP in the last 8760 hours. HbA1C: No results for input(s): HGBA1C in the last 72 hours. CBG: No results for input(s): GLUCAP in the last 168 hours. Lipid Profile: No results for input(s): CHOL, HDL, LDLCALC, TRIG, CHOLHDL, LDLDIRECT in the last 72 hours. Thyroid Function Tests: No results for input(s): TSH, T4TOTAL, FREET4, T3FREE, THYROIDAB in the last 72 hours. Anemia Panel: No results for input(s): VITAMINB12, FOLATE, FERRITIN, TIBC, IRON, RETICCTPCT in the last 72 hours. Urine analysis:    Component Value Date/Time   COLORURINE AMBER* 12/01/2015 2142   APPEARANCEUR CLOUDY* 12/01/2015 2142   LABSPEC 1.019 12/01/2015 2142   PHURINE 5.0 12/01/2015 2142   GLUCOSEU NEGATIVE 12/01/2015 2142   HGBUR NEGATIVE 12/01/2015 2142   BILIRUBINUR SMALL* 12/01/2015 2142   KETONESUR NEGATIVE 12/01/2015 2142   PROTEINUR NEGATIVE 12/01/2015 2142   UROBILINOGEN 0.2 09/12/2013 2022   NITRITE NEGATIVE 12/01/2015 2142   LEUKOCYTESUR NEGATIVE  12/01/2015 2142   Sepsis Labs: @LABRCNTIP (procalcitonin:4,lacticidven:4) )No results found for this or any previous visit (from the past 240 hour(s)).   Radiological Exams on Admission: Dg Chest 2 View  12/01/2015  CLINICAL DATA:  Acute onset of bilateral leg weakness. Initial encounter. EXAM: CHEST  2 VIEW COMPARISON:  Chest radiograph performed 09/12/2013 FINDINGS: The lungs are mildly hypoexpanded. There is mild elevation of the right hemidiaphragm. There is no evidence of focal opacification, pleural effusion or pneumothorax. The heart is borderline normal in size. No acute osseous abnormalities are seen. IMPRESSION: Lungs mildly hypoexpanded but grossly clear, with mild elevation of the right hemidiaphragm. Electronically Signed   By: Garald Balding M.D.   On: 12/01/2015 21:31   Ct Head Wo Contrast  12/01/2015  CLINICAL DATA:  Weakness.  Dizziness for several days. EXAM: CT HEAD WITHOUT CONTRAST TECHNIQUE: Contiguous axial images were obtained from the base of the skull through the vertex without intravenous contrast. COMPARISON:  Head CT 09/12/2013 FINDINGS: No intracranial hemorrhage, mass effect, or midline shift. The degree of atrophy and chronic small vessel ischemia is unchanged from prior exam. No hydrocephalus. The basilar cisterns are patent. No evidence of territorial infarct. No intracranial fluid collection. Atherosclerosis of skullbase vasculature. Calvarium is intact. Included paranasal sinuses and mastoid air cells are well aerated. Improved mucosal thickening of the ethmoid air cells and maxillary sinuses. IMPRESSION: 1.  No acute intracranial abnormality. 2. Atrophy and chronic small vessel ischemia, unchanged from prior exam. Electronically Signed   By: Jeb Levering M.D.   On: 12/01/2015 21:40   Dg Foot 2 Views Left  11/30/2015  CLINICAL DATA:  Swollen, painful left great toe EXAM: LEFT FOOT - 2 VIEW COMPARISON:  None. FINDINGS: Tarsal-metatarsal alignment is normal. No  fracture is seen. No definite erosion is noted. Joint spaces are relatively well preserved for age. There is faint opacity within the soft tissues adjacent to the left first metatarsal head which could represent faint calcification possibly indicative of gout. There is pes planus present. There are degenerative changes in the midfoot with degenerative calcaneal spurs also noted. IMPRESSION: 1. No acute abnormality. 2. Questionable faint calcification in the soft tissues adjacent to the left first MTP joint could indicate gout but no erosions are noted. 3. Pes planus with degenerative changes in the mid and hindfoot. Electronically Signed  By: Ivar Drape M.D.   On: 11/30/2015 15:12    EKG: Not performed, will obtain as appropriate  Assessment/Plan  1. Symptomatic anemia with GI bleed  - Presents with melena, generalized weakness, fatigue, sinus tachycardia, and Hgb 8.0, down from 9.1 the day prior - Known to have diverticulosis from colonoscopy in 2006, at which time bleeding from the ascending colon was noted and transfusions required  - Upper GI source considered given melanotic stool and loss of appetite  - Takes ASA 81 daily; will hold  - Protonix 80 mg IV, followed by infusion  - Clear liquid diet for now  - Given tachycardia and drop of 1 g in 24hrs, will transfuse 1 unit pRBCs now - RN asked to order post-transfusion H&H  - Monitor on telemetry   2. AKI on CKD stage III - SCr 2.23 on admission, up from 1.13 in October 2015  - Likely multifactorial with prerenal azotemia in the setting of poor PO intake last couple weeks with continued losartan use    - Hold losartan and colchicine, avoid nephrotoxins  - Gentle IVF hydration  - Repeat chem panel in am    3. Hypokalemia, hypomagnesemia, hypocalcemia  - Serum potassium 3.3, magnesium 0.9, calcium 6.2 (7.2 when corrected for albumin) on admission  - Likely secondary to acute renal failure, poor nutrition  - Replacing with 40 mEq IV  potassium, 2g IV mag, 1g IV calcium  - Repeat chem panel, mag level in am and replete further prn  - Monitor on telemetry    4. Type II DM  - Serum glucose 237 on admission, no A1c on file  - Managed with metformin, Onglyza, and glipizide at home  - Hold the oral agents while in hospital  - Check CBG with meals and qHS  - Moderate-intensity SSI, adjust prn  - Check A1c     5. Hypertension  - At goal currently  - Managed with losartan at home  - Holding losartan in setting of AKI  - Resume home med as appropriate   6. Hyperlipidemia  - Lipids at goal in March 2009 (last on record), LDL 69, HDL 42 at that time  - Continue current management with Lipitor    7. Gout with acute flare   - Seen at urgent care day prior to admission and treated with colchicine  - Avoid colchine and NSAIDs in light of AKI  - Prednisone 40 mg PO daily     DVT prophylaxis: SCD's  Code Status: Full  Family Communication: Wife and daughter updated at bedside  Disposition Plan: Admit to telemetry   Consults called: None   Admission status: Inpatient    Vianne Bulls, MD [facility] Pager [phone]  If 7PM-7AM, please contact night-coverage [email or web address] Password [email or web address]  12/01/2015, 11:13 PM

## 2015-12-01 NOTE — ED Provider Notes (Signed)
CSN: [id number]     Arrival date & time 12/01/15  1957 History   First MD Initiated Contact with Patient 12/01/15 2025     Chief Complaint  Patient presents with  . Dizziness     (Consider location/radiation/quality/duration/timing/severity/associated sxs/prior Treatment) HPI Comments: Patient presents with weakness. He has a history of hypertension, hyperlipidemia, diabetes and gout. He and his family states that over the last 2 weeks she's had increasing weakness. He's having more difficulty walking due to generalized weakness. He's had decreased appetite and hasn't been eating well. He hasn't had any known fevers. No chest pain or shortness of breath. No recent illnesses although he does report some mucus in his throat.  He also reports dizziness. He denies any vertiginous-type symptoms. He says he feels lightheaded when he stands up. He has recently been treated for gout flareup. He was seen yesterday at urgent care for increase redness and pain to his left toe. He was started on colchicine. He states it's feeling much better and the pain is much improved. He denies any urinary symptoms. He denies any unilateral weakness or numbness although his family states these been dragging his left leg more. He states he thought it was due to the gout but he still feels like it's a little bit weaker than the right side. He denies any arm involvement. There is no speech deficits or vision changes. No recent head injuries. No recent medication changes other than he started on colchicine yesterday.  Patient is a 82 y.o. male presenting with dizziness.  Dizziness Associated symptoms: weakness   Associated symptoms: no blood in stool, no chest pain, no diarrhea, no headaches, no nausea, no shortness of breath and no vomiting     Past Medical History  Diagnosis Date  . Hypertension   . Diabetes mellitus without complication ([place])   . Gout   . Hyperlipidemia    History reviewed. No pertinent past surgical  history. History reviewed. No pertinent family history. Social History  Substance Use Topics  . Smoking status: Former Research scientist (life sciences)  . Smokeless tobacco: None  . Alcohol Use: No    Review of Systems  Constitutional: Positive for appetite change and fatigue. Negative for fever, chills and diaphoresis.  HENT: Negative for congestion, rhinorrhea and sneezing.   Eyes: Negative.   Respiratory: Negative for cough, chest tightness and shortness of breath.   Cardiovascular: Negative for chest pain and leg swelling.  Gastrointestinal: Negative for nausea, vomiting, abdominal pain, diarrhea and blood in stool.  Genitourinary: Negative for frequency, hematuria, flank pain and difficulty urinating.  Musculoskeletal: Positive for arthralgias. Negative for back pain.  Skin: Negative for rash.  Neurological: Positive for dizziness, weakness and light-headedness. Negative for speech difficulty, numbness and headaches.      Allergies  Ace inhibitors; Capoten; Glucophage; and Vasotec  Home Medications   Prior to Admission medications   Medication Sig Start Date End Date Taking? Authorizing Provider  allopurinol (ZYLOPRIM) 100 MG tablet Take 100 mg by mouth daily.    Historical Provider, MD  atorvastatin (LIPITOR) 20 MG tablet Take 20 mg by mouth daily.    Historical Provider, MD  colchicine 0.6 MG tablet Take 2 pills now, then 1 pill in an hour. Wait 3 days before repeating if your gout attack continues. 11/30/15   Jaynee Eagles, PA-C  glipiZIDE (GLUCOTROL XL) 10 MG 24 hr tablet Take 10 mg by mouth daily with breakfast.    Historical Provider, MD  losartan (COZAAR) 100 MG tablet Take 100 mg  by mouth every morning.     Historical Provider, MD  metFORMIN (GLUCOPHAGE) 500 MG tablet Take 500-1,000 mg by mouth See admin instructions. 500mg  q AM, 1000mg  q evening with dinner    Historical Provider, MD  nebivolol (BYSTOLIC) 5 MG tablet Take 5 mg by mouth See admin instructions. 4 days/week  Monday, Wednesday,  Friday, and sunday    Historical Provider, MD  saxagliptin HCl (ONGLYZA) 5 MG TABS tablet Take 5 mg by mouth daily.    Historical Provider, MD  terazosin (HYTRIN) 5 MG capsule Take 5 mg by mouth at bedtime.     Historical Provider, MD   BP 131/74 mmHg  Pulse 106  Temp(Src) 99.1 F (37.3 C) (Oral)  Resp 25  Ht 5\' 8"  (1.727 m)  Wt 200 lb (90.719 kg)  BMI 30.42 kg/m2  SpO2 99% Physical Exam  Constitutional: He is oriented to person, place, and time. He appears well-developed and well-nourished.  HENT:  Head: Normocephalic and atraumatic.  Eyes: Pupils are equal, round, and reactive to light.  Neck: Normal range of motion. Neck supple.  Cardiovascular: Normal heart sounds.  An irregular rhythm present. Tachycardia present.   Pulmonary/Chest: Effort normal and breath sounds normal. No respiratory distress. He has no wheezes. He has no rales. He exhibits no tenderness.  Abdominal: Soft. Bowel sounds are normal. There is no tenderness. There is no rebound and no guarding.  Musculoskeletal: Normal range of motion. He exhibits no edema.  Lymphadenopathy:    He has no cervical adenopathy.  Neurological: He is alert and oriented to person, place, and time.  Motor is 5 out of 5 in upper extremities. It's 4 out of 5 in the lower extremities bilaterally. He is able to raise both legs against gravity but can only hold them up a few seconds. He does feel like his left leg is a little bit weaker than the right. He doesn't have any pain on range of motion of the knee or the hip. There is no pain on range of motion of the ankle or the foot. He has a little bit of tenderness to the MTP joint of the toe. There is mild warmth and erythema. No streaking up the leg. No facial drooping or other obvious cranial nerve abnormality. Finger-nose is intact without pronator drift.  Skin: Skin is warm and dry. No rash noted.  Psychiatric: He has a normal mood and affect.    ED Course  Procedures (including critical  care time) Labs Review Results for orders placed or performed during the hospital encounter of 12/01/15  Comprehensive metabolic panel  Result Value Ref Range   Sodium 140 135 - 145 mmol/L   Potassium 3.3 (L) 3.5 - 5.1 mmol/L   Chloride 103 101 - 111 mmol/L   CO2 23 22 - 32 mmol/L   Glucose, Bld 237 (H) 65 - 99 mg/dL   BUN 46 (H) 6 - 20 mg/dL   Creatinine, Ser 2.23 (H) 0.61 - 1.24 mg/dL   Calcium 6.2 (LL) 8.9 - 10.3 mg/dL   Total Protein 6.1 (L) 6.5 - 8.1 g/dL   Albumin 2.7 (L) 3.5 - 5.0 g/dL   AST 112 (H) 15 - 41 U/L   ALT 43 17 - 63 U/L   Alkaline Phosphatase 208 (H) 38 - 126 U/L   Total Bilirubin 0.7 0.3 - 1.2 mg/dL   GFR calc non Af Amer 25 (L) >60 mL/min   GFR calc Af Amer 29 (L) >60 mL/min   Anion gap  14 5 - 15  CBC with Differential  Result Value Ref Range   WBC 10.1 4.0 - 10.5 K/uL   RBC 2.73 (L) 4.22 - 5.81 MIL/uL   Hemoglobin 8.0 (L) 13.0 - 17.0 g/dL   HCT 24.1 (L) 39.0 - 52.0 %   MCV 88.3 78.0 - 100.0 fL   MCH 29.3 26.0 - 34.0 pg   MCHC 33.2 30.0 - 36.0 g/dL   RDW 15.7 (H) 11.5 - 15.5 %   Platelets 198 150 - 400 K/uL   Neutrophils Relative % 81 %   Neutro Abs 8.1 (H) 1.7 - 7.7 K/uL   Lymphocytes Relative 10 %   Lymphs Abs 1.0 0.7 - 4.0 K/uL   Monocytes Relative 9 %   Monocytes Absolute 0.9 0.1 - 1.0 K/uL   Eosinophils Relative 0 %   Eosinophils Absolute 0.0 0.0 - 0.7 K/uL   Basophils Relative 0 %   Basophils Absolute 0.0 0.0 - 0.1 K/uL  Urinalysis, Routine w reflex microscopic  Result Value Ref Range   Color, Urine AMBER (A) YELLOW   APPearance CLOUDY (A) CLEAR   Specific Gravity, Urine 1.019 1.005 - 1.030   pH 5.0 5.0 - 8.0   Glucose, UA NEGATIVE NEGATIVE mg/dL   Hgb urine dipstick NEGATIVE NEGATIVE   Bilirubin Urine SMALL (A) NEGATIVE   Ketones, ur NEGATIVE NEGATIVE mg/dL   Protein, ur NEGATIVE NEGATIVE mg/dL   Nitrite NEGATIVE NEGATIVE   Leukocytes, UA NEGATIVE NEGATIVE  Magnesium  Result Value Ref Range   Magnesium 0.9 (LL) 1.7 - 2.4 mg/dL   POC occult blood, ED RN will collect  Result Value Ref Range   Fecal Occult Bld POSITIVE (A) NEGATIVE   Dg Chest 2 View  12/01/2015  CLINICAL DATA:  Acute onset of bilateral leg weakness. Initial encounter. EXAM: CHEST  2 VIEW COMPARISON:  Chest radiograph performed 09/12/2013 FINDINGS: The lungs are mildly hypoexpanded. There is mild elevation of the right hemidiaphragm. There is no evidence of focal opacification, pleural effusion or pneumothorax. The heart is borderline normal in size. No acute osseous abnormalities are seen. IMPRESSION: Lungs mildly hypoexpanded but grossly clear, with mild elevation of the right hemidiaphragm. Electronically Signed   By: Garald Balding M.D.   On: 12/01/2015 21:31   Ct Head Wo Contrast  12/01/2015  CLINICAL DATA:  Weakness.  Dizziness for several days. EXAM: CT HEAD WITHOUT CONTRAST TECHNIQUE: Contiguous axial images were obtained from the base of the skull through the vertex without intravenous contrast. COMPARISON:  Head CT 09/12/2013 FINDINGS: No intracranial hemorrhage, mass effect, or midline shift. The degree of atrophy and chronic small vessel ischemia is unchanged from prior exam. No hydrocephalus. The basilar cisterns are patent. No evidence of territorial infarct. No intracranial fluid collection. Atherosclerosis of skullbase vasculature. Calvarium is intact. Included paranasal sinuses and mastoid air cells are well aerated. Improved mucosal thickening of the ethmoid air cells and maxillary sinuses. IMPRESSION: 1.  No acute intracranial abnormality. 2. Atrophy and chronic small vessel ischemia, unchanged from prior exam. Electronically Signed   By: Jeb Levering M.D.   On: 12/01/2015 21:40   Dg Foot 2 Views Left  11/30/2015  CLINICAL DATA:  Swollen, painful left great toe EXAM: LEFT FOOT - 2 VIEW COMPARISON:  None. FINDINGS: Tarsal-metatarsal alignment is normal. No fracture is seen. No definite erosion is noted. Joint spaces are relatively well  preserved for age. There is faint opacity within the soft tissues adjacent to the left first metatarsal head which could represent  faint calcification possibly indicative of gout. There is pes planus present. There are degenerative changes in the midfoot with degenerative calcaneal spurs also noted. IMPRESSION: 1. No acute abnormality. 2. Questionable faint calcification in the soft tissues adjacent to the left first MTP joint could indicate gout but no erosions are noted. 3. Pes planus with degenerative changes in the mid and hindfoot. Electronically Signed   By: Ivar Drape M.D.   On: 11/30/2015 15:12      Imaging Review Dg Chest 2 View  12/01/2015  CLINICAL DATA:  Acute onset of bilateral leg weakness. Initial encounter. EXAM: CHEST  2 VIEW COMPARISON:  Chest radiograph performed 09/12/2013 FINDINGS: The lungs are mildly hypoexpanded. There is mild elevation of the right hemidiaphragm. There is no evidence of focal opacification, pleural effusion or pneumothorax. The heart is borderline normal in size. No acute osseous abnormalities are seen. IMPRESSION: Lungs mildly hypoexpanded but grossly clear, with mild elevation of the right hemidiaphragm. Electronically Signed   By: Garald Balding M.D.   On: 12/01/2015 21:31   Ct Head Wo Contrast  12/01/2015  CLINICAL DATA:  Weakness.  Dizziness for several days. EXAM: CT HEAD WITHOUT CONTRAST TECHNIQUE: Contiguous axial images were obtained from the base of the skull through the vertex without intravenous contrast. COMPARISON:  Head CT 09/12/2013 FINDINGS: No intracranial hemorrhage, mass effect, or midline shift. The degree of atrophy and chronic small vessel ischemia is unchanged from prior exam. No hydrocephalus. The basilar cisterns are patent. No evidence of territorial infarct. No intracranial fluid collection. Atherosclerosis of skullbase vasculature. Calvarium is intact. Included paranasal sinuses and mastoid air cells are well aerated. Improved mucosal  thickening of the ethmoid air cells and maxillary sinuses. IMPRESSION: 1.  No acute intracranial abnormality. 2. Atrophy and chronic small vessel ischemia, unchanged from prior exam. Electronically Signed   By: Jeb Levering M.D.   On: 12/01/2015 21:40   Dg Foot 2 Views Left  11/30/2015  CLINICAL DATA:  Swollen, painful left great toe EXAM: LEFT FOOT - 2 VIEW COMPARISON:  None. FINDINGS: Tarsal-metatarsal alignment is normal. No fracture is seen. No definite erosion is noted. Joint spaces are relatively well preserved for age. There is faint opacity within the soft tissues adjacent to the left first metatarsal head which could represent faint calcification possibly indicative of gout. There is pes planus present. There are degenerative changes in the midfoot with degenerative calcaneal spurs also noted. IMPRESSION: 1. No acute abnormality. 2. Questionable faint calcification in the soft tissues adjacent to the left first MTP joint could indicate gout but no erosions are noted. 3. Pes planus with degenerative changes in the mid and hindfoot. Electronically Signed   By: Ivar Drape M.D.   On: 11/30/2015 15:12   I have personally reviewed and evaluated these images and lab results as part of my medical decision-making.   EKG Interpretation None     ED ECG REPORT   Date: 12/01/2015  Rate: 102  Rhythm: sinus tachycardia and premature ventricular contractions (PVC)  QRS Axis: normal  Intervals: normal  ST/T Wave abnormalities: nonspecific ST/T changes  Conduction Disutrbances:right bundle branch block  Narrative Interpretation:   Old EKG Reviewed: none available  I have personally reviewed the EKG tracing and agree with the computerized printout as noted.   MDM   Final diagnoses:  Other iron deficiency anemias  Hypocalcemia  Hypomagnesemia  AKI (acute kidney injury) ([place])  Weakness    Patient presents with generalized weakness, fatigue and decreased appetite. He has  mild tachycardia  but his other vital signs are stable. He doesn't have any symptoms that would be more consistent with acute coronary syndrome. His chest x-ray is clear without evidence of pneumonia. He has no suggestions of infection. He does have some mild swelling and erythema to his left big toe but it seems to be improving since he started on call to see him yesterday. His head CT is negative for acute abnormality. He does have some mild increased weakness in his left leg as compared to his right but he has no other neurologic deficits or suggestions of an acute stroke. He does have multiple lab abnormalities. He has anemia which is a bit worse than his baseline hemoglobin. He has an acute kidney injury. He has hypocalcemia and hypomagnesemia.  His alkaline phosphatase is increased. I feel that he needs admission for further evaluation and treatment. I'll consult the hospitalist for admission.  I spoke with Dr. Myna Hidalgo.  Will admit to telemetry.  Malvin Johns, MD 12/01/15 2235

## 2015-12-01 NOTE — ED Notes (Signed)
MD at bedside. 

## 2015-12-01 NOTE — ED Notes (Signed)
Per EMS pt from home after reporting dizziness that started several days ago. Pt reports being seen at urgent care on 11/30/15 for gout to left toe. Pt states that dizziness occurs with standing and has been ongoing since 11/27/15.

## 2015-12-02 DIAGNOSIS — K921 Melena: Secondary | ICD-10-CM

## 2015-12-02 DIAGNOSIS — M1000 Idiopathic gout, unspecified site: Secondary | ICD-10-CM

## 2015-12-02 DIAGNOSIS — R195 Other fecal abnormalities: Secondary | ICD-10-CM

## 2015-12-02 DIAGNOSIS — N179 Acute kidney failure, unspecified: Secondary | ICD-10-CM

## 2015-12-02 LAB — COMPREHENSIVE METABOLIC PANEL
ALK PHOS: 214 U/L — AB (ref 38–126)
ALT: 41 U/L (ref 17–63)
AST: 106 U/L — AB (ref 15–41)
Albumin: 2.7 g/dL — ABNORMAL LOW (ref 3.5–5.0)
Anion gap: 11 (ref 5–15)
BUN: 42 mg/dL — AB (ref 6–20)
CALCIUM: 6.3 mg/dL — AB (ref 8.9–10.3)
CHLORIDE: 109 mmol/L (ref 101–111)
CO2: 22 mmol/L (ref 22–32)
CREATININE: 1.82 mg/dL — AB (ref 0.61–1.24)
GFR, EST AFRICAN AMERICAN: 38 mL/min — AB (ref >60)
GFR, EST NON AFRICAN AMERICAN: 32 mL/min — AB (ref >60)
Glucose, Bld: 203 mg/dL — ABNORMAL HIGH (ref 65–99)
Potassium: 3.5 mmol/L (ref 3.5–5.1)
SODIUM: 142 mmol/L (ref 135–145)
Total Bilirubin: 0.7 mg/dL (ref 0.3–1.2)
Total Protein: 5.9 g/dL — ABNORMAL LOW (ref 6.5–8.1)

## 2015-12-02 LAB — GLUCOSE, CAPILLARY
GLUCOSE-CAPILLARY: 204 mg/dL — AB (ref 65–99)
Glucose-Capillary: 187 mg/dL — ABNORMAL HIGH (ref 65–99)
Glucose-Capillary: 237 mg/dL — ABNORMAL HIGH (ref 65–99)
Glucose-Capillary: 239 mg/dL — ABNORMAL HIGH (ref 65–99)

## 2015-12-02 LAB — CBC
HEMATOCRIT: 25.4 % — AB (ref 39.0–52.0)
HEMOGLOBIN: 8.5 g/dL — AB (ref 13.0–17.0)
MCH: 29.4 pg (ref 26.0–34.0)
MCHC: 33.5 g/dL (ref 30.0–36.0)
MCV: 87.9 fL (ref 78.0–100.0)
Platelets: 180 10*3/uL (ref 150–400)
RBC: 2.89 MIL/uL — ABNORMAL LOW (ref 4.22–5.81)
RDW: 15.3 % (ref 11.5–15.5)
WBC: 11.2 10*3/uL — ABNORMAL HIGH (ref 4.0–10.5)

## 2015-12-02 LAB — VITAMIN B12: VITAMIN B 12: 3098 pg/mL — AB (ref 180–914)

## 2015-12-02 LAB — MAGNESIUM: Magnesium: 1.3 mg/dL — ABNORMAL LOW (ref 1.7–2.4)

## 2015-12-02 LAB — IRON AND TIBC
IRON: 25 ug/dL — AB (ref 45–182)
SATURATION RATIOS: 11 % — AB (ref 17.9–39.5)
TIBC: 232 ug/dL — AB (ref 250–450)
UIBC: 207 ug/dL

## 2015-12-02 LAB — NA AND K (SODIUM & POTASSIUM), RAND UR
Potassium Urine: 29 mmol/L
Sodium, Ur: 47 mmol/L

## 2015-12-02 LAB — FERRITIN: Ferritin: 582 ng/mL — ABNORMAL HIGH (ref 24–336)

## 2015-12-02 LAB — PREPARE RBC (CROSSMATCH)

## 2015-12-02 LAB — ABO/RH: ABO/RH(D): O POS

## 2015-12-02 LAB — OSMOLALITY, URINE: OSMOLALITY UR: 400 mosm/kg (ref 300–900)

## 2015-12-02 LAB — CREATININE, URINE, RANDOM: Creatinine, Urine: 78.84 mg/dL

## 2015-12-02 MED ORDER — MAGNESIUM SULFATE 2 GM/50ML IV SOLN
2.0000 g | Freq: Once | INTRAVENOUS | Status: AC
  Administered 2015-12-02: 2 g via INTRAVENOUS
  Filled 2015-12-02: qty 50

## 2015-12-02 MED ORDER — PANTOPRAZOLE SODIUM 40 MG IV SOLR
40.0000 mg | Freq: Two times a day (BID) | INTRAVENOUS | Status: DC
  Administered 2015-12-02 – 2015-12-04 (×5): 40 mg via INTRAVENOUS
  Filled 2015-12-02 (×5): qty 40

## 2015-12-02 MED ORDER — POTASSIUM CHLORIDE 10 MEQ/100ML IV SOLN
10.0000 meq | Freq: Once | INTRAVENOUS | Status: AC
  Administered 2015-12-02: 10 meq via INTRAVENOUS
  Filled 2015-12-02: qty 100

## 2015-12-02 MED ORDER — AMLODIPINE BESYLATE 5 MG PO TABS
5.0000 mg | ORAL_TABLET | Freq: Every day | ORAL | Status: DC
  Administered 2015-12-02 – 2015-12-04 (×3): 5 mg via ORAL
  Filled 2015-12-02 (×2): qty 1

## 2015-12-02 MED ORDER — COLCHICINE 0.6 MG PO TABS
0.6000 mg | ORAL_TABLET | Freq: Every day | ORAL | Status: DC
  Administered 2015-12-02 – 2015-12-04 (×3): 0.6 mg via ORAL
  Filled 2015-12-02 (×3): qty 1

## 2015-12-02 MED ORDER — CALCIUM CARBONATE ANTACID 500 MG PO CHEW
2.0000 | CHEWABLE_TABLET | Freq: Once | ORAL | Status: AC
  Administered 2015-12-02: 400 mg via ORAL
  Filled 2015-12-02: qty 2

## 2015-12-02 MED ORDER — INSULIN ASPART 100 UNIT/ML ~~LOC~~ SOLN
0.0000 [IU] | SUBCUTANEOUS | Status: DC
  Administered 2015-12-02 (×3): 5 [IU] via SUBCUTANEOUS
  Administered 2015-12-03: 3 [IU] via SUBCUTANEOUS
  Administered 2015-12-03: 11 [IU] via SUBCUTANEOUS
  Administered 2015-12-03: 5 [IU] via SUBCUTANEOUS
  Administered 2015-12-04: 2 [IU] via SUBCUTANEOUS

## 2015-12-02 NOTE — Progress Notes (Signed)
PROGRESS NOTE    [name] [name]  [id number] DOB: May 20, 1929 DOA: 12/01/2015 PCP: No PCP Per Patient  Brief Narrative: [name] [name] is a 82 year old gentleman with a past medical history of diabetes mellitus, hypertension, dyslipidemia, having history of GI bleed in 2012 undergoing colonoscopy, presenting to the emergency department on 12/01/2015 with a 2 day history of melena associate with generalized weakness and fatigue. He denied bright red blood per rectum or hematemesis. He was found to be guaiac positive having a hemoglobin of 8.0. He was started on IV Protonix and GI was consulted.                     Assessment & Plan:   Principal Problem:   AKI (acute kidney injury) ([place]) Active Problems:   Hypertension, essential   Non-insulin dependent type 2 diabetes mellitus ([facility])   Hyperlipidemia   Gout of big toe   Normocytic anemia   Hypokalemia   Hypocalcemia   Hypomagnesemia   Occult GI bleeding   Generalized weakness   Lower GI bleed   1.  Suspected upper GI bleed -[name] [name] presents with a 2 day history of black tarry stools, found to be guaiac positive in the emergency department, with lab work showing a downward trend in his hemoglobin to 8.0. -He had been on aspirin therapy, denied other NSAID use or anticoagulants. -GI was consulted for possible upper endoscopy. -Having symptomatic anemia he was transfused with a unit of packed red blood cells.  2.  Acute blood loss anemia. -Lab work showing a downward trend in hemoglobin to 8.0, previously 9.1 day prior. -He presents with complaints of generalized weakness and fatigue. -He was typed and crossed and was transfused 1 unit of packed red blood cells with subsequent improvement to symptoms.  3.  Acute kidney injury.  -Initial lab work showing creatinine of 2.23 with BUN of 46. -IV secondary to hypovolemia in setting of GI bleed. -At receiving blood transfusion creatinine trended down to 1.8.  4.   Hypomagnesemia. -Initial lab work revealed a magnesium of 0.9 -Improved to 1.3 on a.m. lab work after receiving IV magnesium replacement. -Magnesium remains low, will administer 2 g of IV magnesium today.  5.  Hypokalemia. -Lab work initially revealed assessment 3.3, improving to 3.5 on a.m. lab work after replacement -Magnesium is being corrected.  6.  Acute gout -Having improvement to kidney function. -Will treat with colchicine 0.6 mg PO q daily  7.  Diabetes mellitus. -Oral hypoglycemic agents held as he is currently nothing by mouth. -Provide sliding scale coverage every 4 hours.  DVT prophylaxis: SCD's Code Status: Full Code Family Communication:  Disposition Plan: GI consulted, continue supportive care, blood transfusion   Consultants:   GI  Subjective: States feeling little better, he is pleasant, calm, cooperative  Objective: Filed Vitals:   12/02/15 0519 12/02/15 0552 12/02/15 0620 12/02/15 0900  BP: 152/96 144/90 147/95 160/99  Pulse: 100 94 96 98  Temp: 97.9 F (36.6 C) 99.4 F (37.4 C) 99 F (37.2 C) 98.4 F (36.9 C)  TempSrc: Oral Oral Oral Oral  Resp: 20 24 24 18   Height:      Weight:      SpO2: 99% 98% 98% 98%    Intake/Output Summary (Last 24 hours) at 12/02/15 1146 Last data filed at 12/02/15 0900  Gross per 24 hour  Intake   1050 ml  Output    200 ml  Net    850 ml   Filed  Weights   12/01/15 2012 12/02/15 0027  Weight: 90.719 kg (200 lb) 89.8 kg (197 lb 15.6 oz)    Examination:  General exam: Appears calm and comfortable, Nontoxic appearing awake and alert  Respiratory system: Clear to auscultation. Respiratory effort normal. Cardiovascular system: S1 & S2 heard, RRR. No JVD, murmurs, rubs, gallops or clicks. No pedal edema. Gastrointestinal system: Abdomen is nondistended, soft and nontender. No organomegaly or masses felt. Normal bowel sounds heard. Central nervous system: Alert and oriented. No focal neurological  deficits. Extremities: Symmetric 5 x 5 power. Skin: No rashes, lesions or ulcers Psychiatry: Judgement and insight appear normal. Mood & affect appropriate.     Data Reviewed: I have personally reviewed following labs and imaging studies  CBC:  Recent Labs Lab 11/30/15 1457 12/01/15 2056 12/02/15 1020  WBC 12.3* 10.1 11.2*  NEUTROABS  --  8.1*  --   HGB 9.1* 8.0* 8.5*  HCT 26.4* 24.1* 25.4*  MCV 87.1 88.3 87.9  PLT  --  198 [id number]   Basic Metabolic Panel:  Recent Labs Lab 11/30/15 1448 12/01/15 2056 12/01/15 2102 12/02/15 0512  NA 142 140  --  142  K 4.1 3.3*  --  3.5  CL 103 103  --  109  CO2 19* 23  --  22  GLUCOSE 281* 237*  --  203*  BUN 39* 46*  --  42*  CREATININE 2.08* 2.23*  --  1.82*  CALCIUM 6.5* 6.2*  --  6.3*  MG  --   --  0.9* 1.3*   GFR: Estimated Creatinine Clearance: 32.9 mL/min (by C-G formula based on Cr of 1.82). Liver Function Tests:  Recent Labs Lab 11/30/15 1448 12/01/15 2056 12/02/15 0512  AST 116* 112* 106*  ALT 44 43 41  ALKPHOS 252* 208* 214*  BILITOT 0.8 0.7 0.7  PROT 6.3 6.1* 5.9*  ALBUMIN 3.2* 2.7* 2.7*   No results for input(s): LIPASE, AMYLASE in the last 168 hours. No results for input(s): AMMONIA in the last 168 hours. Coagulation Profile:  Recent Labs Lab 12/01/15 2339  INR 1.20   Cardiac Enzymes: No results for input(s): CKTOTAL, CKMB, CKMBINDEX, TROPONINI in the last 168 hours. BNP (last 3 results) No results for input(s): PROBNP in the last 8760 hours. HbA1C: No results for input(s): HGBA1C in the last 72 hours. CBG:  Recent Labs Lab 12/02/15 0756  GLUCAP 187*   Lipid Profile: No results for input(s): CHOL, HDL, LDLCALC, TRIG, CHOLHDL, LDLDIRECT in the last 72 hours. Thyroid Function Tests: No results for input(s): TSH, T4TOTAL, FREET4, T3FREE, THYROIDAB in the last 72 hours. Anemia Panel:  Recent Labs  12/01/15 2339  VITAMINB12 3098*  FERRITIN 582*  TIBC 232*  IRON 25*  RETICCTPCT 2.5    Sepsis Labs: No results for input(s): PROCALCITON, LATICACIDVEN in the last 168 hours.  No results found for this or any previous visit (from the past 240 hour(s)).       Radiology Studies: Dg Chest 2 View  12/01/2015  CLINICAL DATA:  Acute onset of bilateral leg weakness. Initial encounter. EXAM: CHEST  2 VIEW COMPARISON:  Chest radiograph performed 09/12/2013 FINDINGS: The lungs are mildly hypoexpanded. There is mild elevation of the right hemidiaphragm. There is no evidence of focal opacification, pleural effusion or pneumothorax. The heart is borderline normal in size. No acute osseous abnormalities are seen. IMPRESSION: Lungs mildly hypoexpanded but grossly clear, with mild elevation of the right hemidiaphragm. Electronically Signed   By: Francoise Schaumann.D.  On: 12/01/2015 21:31   Ct Head Wo Contrast  12/01/2015  CLINICAL DATA:  Weakness.  Dizziness for several days. EXAM: CT HEAD WITHOUT CONTRAST TECHNIQUE: Contiguous axial images were obtained from the base of the skull through the vertex without intravenous contrast. COMPARISON:  Head CT 09/12/2013 FINDINGS: No intracranial hemorrhage, mass effect, or midline shift. The degree of atrophy and chronic small vessel ischemia is unchanged from prior exam. No hydrocephalus. The basilar cisterns are patent. No evidence of territorial infarct. No intracranial fluid collection. Atherosclerosis of skullbase vasculature. Calvarium is intact. Included paranasal sinuses and mastoid air cells are well aerated. Improved mucosal thickening of the ethmoid air cells and maxillary sinuses. IMPRESSION: 1.  No acute intracranial abnormality. 2. Atrophy and chronic small vessel ischemia, unchanged from prior exam. Electronically Signed   By: Jeb Levering M.D.   On: 12/01/2015 21:40   Dg Foot 2 Views Left  11/30/2015  CLINICAL DATA:  Swollen, painful left great toe EXAM: LEFT FOOT - 2 VIEW COMPARISON:  None. FINDINGS: Tarsal-metatarsal alignment is  normal. No fracture is seen. No definite erosion is noted. Joint spaces are relatively well preserved for age. There is faint opacity within the soft tissues adjacent to the left first metatarsal head which could represent faint calcification possibly indicative of gout. There is pes planus present. There are degenerative changes in the midfoot with degenerative calcaneal spurs also noted. IMPRESSION: 1. No acute abnormality. 2. Questionable faint calcification in the soft tissues adjacent to the left first MTP joint could indicate gout but no erosions are noted. 3. Pes planus with degenerative changes in the mid and hindfoot. Electronically Signed   By: Ivar Drape M.D.   On: 11/30/2015 15:12        Scheduled Meds: . atorvastatin  20 mg Oral Daily  . darifenacin  7.5 mg Oral Daily  . insulin aspart  0-15 Units Subcutaneous TID WC  . latanoprost  1 drop Both Eyes QHS  . pantoprazole (PROTONIX) IV  40 mg Intravenous Q12H  . predniSONE  40 mg Oral Q breakfast  . sodium chloride flush  3 mL Intravenous Q12H  . terazosin  5 mg Oral QHS   Continuous Infusions:    LOS: 1 day   Time spent: 35 min  Kelvin Cellar, MD [facility] Pager [phone]  If 7PM-7AM, please contact night-coverage [email or web address] Password [facility] 12/02/2015, 11:46 AM

## 2015-12-02 NOTE — Consult Note (Signed)
Referring Provider: No ref. provider found Primary Care Physician:  No PCP Per Patient Primary Gastroenterologist:  ? Dr. Collene Mares  Reason for Consultation:  Anemia and heme positive stool  HPI: [name] [name] is a 82 y.o. male with medical history significant for hypertension, type 2 diabetes mellitus, hyperlipidemia, gout, and pan-diverticulosis with lower GI bleed in 2006 who now presents to the ED with a few days of dark stools.  Says that he's had loss of appetite recently as well and has lost 30-40 pounds but that has been over quite some time.  Reports 2-3 days of generalized weakness and fatigue. Patient says that for the past 3 days or so his stools have been very dark/black in colon.  No red blood noted.  He reports that food no longer has much of the taste.  2-3 days ago he developed an acute gout flare in the left first MTP as well.  He was evaluated for the gout flare at an urgent care and treated effectively with colchicine. Patient denies any abdominal pain, nausea, vomiting, or diarrhea.  Moves his bowels regularly.  Aside from the recent course of colchicine, he denies any new medications or changes in his medications. He takes a daily aspirin 81 mg, but no anticoagulation.  No NSAID's.  Of note, the patient was admitted for a diverticular bleed in 2006, found to have pan-diverticulosis on colonoscopy.  ED Course: Upon arrival to the ED, patient is found to be afebrile, saturating well on room air, tachycardic in the low 100s, and with vitals otherwise stable. CMP features a hypokalemia, serum creatinine of 2.23, up from an apparent baseline of just over 1. Chemistry panels also notable for albumin of 2.7, calcium of 6.2, glucose of 237, and magnesium of 0.9. CBC features a hemoglobin of 8.0 grams, down from 9.1 grams at an urgent care 1 day prior. DRE produced dark stool which was FOBT positive. Type and screen was performed and a 500 cc normal saline bolus was given. Patient remained  hemodynamically stable in the emergency department and was admitted to the hospital for ongoing evaluation and management of acute renal failure with widespread electrolyte derangements and symptomatic anemia.  He received one unit PRBC's this AM.  He has not moved his bowels since admission.    Endoscopy history:  Colonoscopy by Dr. Collene Mares in 03/27/2005 showed diverticular bleed with a visible vessel in the proximal right colon with clips applied and epi injected.  Colonoscopy just five days prior to above, 03/22/2005, showed a polyp that was removed from rectum that was a tubulovillous adenoma.   Past Medical History  Diagnosis Date  . Hypertension   . Diabetes mellitus without complication ([place])   . Gout   . Hyperlipidemia     History reviewed. No pertinent past surgical history.  Prior to Admission medications   Medication Sig Start Date End Date Taking? Authorizing Provider  atorvastatin (LIPITOR) 20 MG tablet Take 20 mg by mouth daily.   Yes Historical Provider, MD  colchicine 0.6 MG tablet Take 2 pills now, then 1 pill in an hour. Wait 3 days before repeating if your gout attack continues. 11/30/15  Yes Jaynee Eagles, PA-C  glipiZIDE (GLUCOTROL XL) 10 MG 24 hr tablet Take 10 mg by mouth daily with breakfast.   Yes Historical Provider, MD  latanoprost (XALATAN) 0.005 % ophthalmic solution Place 1 drop into both eyes at bedtime. 11/13/15  Yes Historical Provider, MD  losartan (COZAAR) 100 MG tablet Take 100 mg  by mouth every morning.    Yes Historical Provider, MD  metFORMIN (GLUCOPHAGE) 500 MG tablet Take 1,000 mg by mouth every evening.    Yes Historical Provider, MD  PRESCRIPTION MEDICATION Sample of Janumet- unsure of strength.   Yes Historical Provider, MD  saxagliptin HCl (ONGLYZA) 5 MG TABS tablet Take 5 mg by mouth daily.   Yes Historical Provider, MD  terazosin (HYTRIN) 5 MG capsule Take 5 mg by mouth at bedtime.    Yes Historical Provider, MD  VESICARE 5 MG tablet Take 5 mg by  mouth daily. 11/27/15  Yes Historical Provider, MD    Current Facility-Administered Medications  Medication Dose Route Frequency Provider Last Rate Last Dose  . 0.9 %  sodium chloride infusion   Intravenous Continuous Vianne Bulls, MD 75 mL/hr at 12/02/15 0046    . acetaminophen (TYLENOL) tablet 650 mg  650 mg Oral Q6H PRN Vianne Bulls, MD       Or  . acetaminophen (TYLENOL) suppository 650 mg  650 mg Rectal Q6H PRN Vianne Bulls, MD      . atorvastatin (LIPITOR) tablet 20 mg  20 mg Oral Daily Ilene Qua Opyd, MD      . darifenacin (ENABLEX) 24 hr tablet 7.5 mg  7.5 mg Oral Daily Ilene Qua Opyd, MD      . HYDROcodone-acetaminophen (NORCO/VICODIN) 5-325 MG per tablet 1-2 tablet  1-2 tablet Oral Q4H PRN Ilene Qua Opyd, MD      . insulin aspart (novoLOG) injection 0-15 Units  0-15 Units Subcutaneous TID WC Vianne Bulls, MD   3 Units at 12/02/15 [phone]  . latanoprost (XALATAN) 0.005 % ophthalmic solution 1 drop  1 drop Both Eyes QHS Vianne Bulls, MD   1 drop at 12/02/15 0113  . ondansetron (ZOFRAN) tablet 4 mg  4 mg Oral Q6H PRN Vianne Bulls, MD       Or  . ondansetron (ZOFRAN) injection 4 mg  4 mg Intravenous Q6H PRN Vianne Bulls, MD      . pantoprazole (PROTONIX) 80 mg in sodium chloride 0.9 % 250 mL (0.32 mg/mL) infusion  8 mg/hr Intravenous Continuous Vianne Bulls, MD 25 mL/hr at 12/02/15 0128 8 mg/hr at 12/02/15 0128  . [START ON 12/05/2015] pantoprazole (PROTONIX) injection 40 mg  40 mg Intravenous Q12H Timothy S Opyd, MD      . predniSONE (DELTASONE) tablet 40 mg  40 mg Oral Q breakfast Vianne Bulls, MD   40 mg at 12/02/15 0824  . sodium chloride flush (NS) 0.9 % injection 3 mL  3 mL Intravenous Q12H Vianne Bulls, MD   3 mL at 12/02/15 0045  . terazosin (HYTRIN) capsule 5 mg  5 mg Oral QHS Ilene Qua Opyd, MD   5 mg at 12/02/15 0128    Allergies as of 12/01/2015 - Review Complete 12/01/2015  Allergen Reaction Noted  . Ace inhibitors  09/12/2013  . Capoten [captopril]   09/12/2013  . Glucophage [metformin]  09/12/2013  . Vasotec [enalapril]  09/12/2013    History reviewed. No pertinent family history.  Social History   Social History  . Marital Status: Married    Spouse Name: N/A  . Number of Children: N/A  . Years of Education: N/A   Occupational History  . Not on file.   Social History Main Topics  . Smoking status: Former Research scientist (life sciences)  . Smokeless tobacco: Not on file  . Alcohol Use: No  . Drug Use: No  .  Sexual Activity: Not on file   Other Topics Concern  . Not on file   Social History Narrative    Review of Systems: Ten point ROS is O/W negative except as mentioned in HPI.  Physical Exam: Vital signs in last 24 hours: Temp:  [97.9 F (36.6 C)-99.4 F (37.4 C)] 99 F (37.2 C) (05/27 0620) Pulse Rate:  [94-106] 96 (05/27 0620) Resp:  [20-28] 24 (05/27 0620) BP: (128-154)/(69-96) 147/95 mmHg (05/27 0620) SpO2:  [97 %-100 %] 98 % (05/27 0620) Weight:  [197 lb 15.6 oz (89.8 kg)-200 lb (90.719 kg)] 197 lb 15.6 oz (89.8 kg) (05/27 0027) Last BM Date: 12/02/15 General:  Alert, Well-developed, well-nourished, pleasant and cooperative in NAD Head:  Normocephalic and atraumatic. Eyes:  Sclera clear, no icterus.  Conjunctiva pink. Ears:  Normal auditory acuity. Mouth:  No deformity or lesions.   Lungs:  Clear throughout to auscultation.  No wheezes, crackles, or rhonchi.  Heart:  Regular rate and rhythm; no murmurs, clicks, rubs, or gallops. Abdomen:  Soft, non-distended.  BS present.  Non-tender.  Rectal:  Deferred.  Was heme positive. Msk:  Symmetrical without gross deformities. Pulses:  Normal pulses noted. Extremities:  Without clubbing or edema. Neurologic:  Alert and oriented x 4;  grossly normal neurologically. Skin:  Intact without significant lesions or rashes. Psych:  Alert and cooperative. Normal mood and affect.  Intake/Output from previous day: 05/26 0701 - 05/27 0700 In: 30 [Blood:30] Out: 100 [Urine:100]  Lab  Results:  Recent Labs  11/30/15 1457 12/01/15 2056  WBC 12.3* 10.1  HGB 9.1* 8.0*  HCT 26.4* 24.1*  PLT  --  198   BMET  Recent Labs  11/30/15 1448 12/01/15 2056 12/02/15 0512  NA 142 140 142  K 4.1 3.3* 3.5  CL 103 103 109  CO2 19* 23 22  GLUCOSE 281* 237* 203*  BUN 39* 46* 42*  CREATININE 2.08* 2.23* 1.82*  CALCIUM 6.5* 6.2* 6.3*   LFT  Recent Labs  12/02/15 0512  PROT 5.9*  ALBUMIN 2.7*  AST 106*  ALT 41  ALKPHOS 214*  BILITOT 0.7   PT/INR  Recent Labs  12/01/15 2339  LABPROT 15.4*  INR 1.20   Studies/Results: Dg Chest 2 View  12/01/2015  CLINICAL DATA:  Acute onset of bilateral leg weakness. Initial encounter. EXAM: CHEST  2 VIEW COMPARISON:  Chest radiograph performed 09/12/2013 FINDINGS: The lungs are mildly hypoexpanded. There is mild elevation of the right hemidiaphragm. There is no evidence of focal opacification, pleural effusion or pneumothorax. The heart is borderline normal in size. No acute osseous abnormalities are seen. IMPRESSION: Lungs mildly hypoexpanded but grossly clear, with mild elevation of the right hemidiaphragm. Electronically Signed   By: Garald Balding M.D.   On: 12/01/2015 21:31   Ct Head Wo Contrast  12/01/2015  CLINICAL DATA:  Weakness.  Dizziness for several days. EXAM: CT HEAD WITHOUT CONTRAST TECHNIQUE: Contiguous axial images were obtained from the base of the skull through the vertex without intravenous contrast. COMPARISON:  Head CT 09/12/2013 FINDINGS: No intracranial hemorrhage, mass effect, or midline shift. The degree of atrophy and chronic small vessel ischemia is unchanged from prior exam. No hydrocephalus. The basilar cisterns are patent. No evidence of territorial infarct. No intracranial fluid collection. Atherosclerosis of skullbase vasculature. Calvarium is intact. Included paranasal sinuses and mastoid air cells are well aerated. Improved mucosal thickening of the ethmoid air cells and maxillary sinuses. IMPRESSION:  1.  No acute intracranial abnormality. 2. Atrophy and chronic  small vessel ischemia, unchanged from prior exam. Electronically Signed   By: Jeb Levering M.D.   On: 12/01/2015 21:40   Dg Foot 2 Views Left  11/30/2015  CLINICAL DATA:  Swollen, painful left great toe EXAM: LEFT FOOT - 2 VIEW COMPARISON:  None. FINDINGS: Tarsal-metatarsal alignment is normal. No fracture is seen. No definite erosion is noted. Joint spaces are relatively well preserved for age. There is faint opacity within the soft tissues adjacent to the left first metatarsal head which could represent faint calcification possibly indicative of gout. There is pes planus present. There are degenerative changes in the midfoot with degenerative calcaneal spurs also noted. IMPRESSION: 1. No acute abnormality. 2. Questionable faint calcification in the soft tissues adjacent to the left first MTP joint could indicate gout but no erosions are noted. 3. Pes planus with degenerative changes in the mid and hindfoot. Electronically Signed   By: Ivar Drape M.D.   On: 11/30/2015 15:12   IMPRESSION:  -82 year old male with reports of recent dizziness, weakness, fatigue and associated dark stools at home.  Was heme negative and found to have Hgb of 8.0 grams, which is compared to 9.1 grams just one day prior.  Was admitted for symptomatic, acute on chronic, anemia due to GI bleed.  Received one unit PRBC's this AM.  Is on PPI gtt. -History of diverticular bleed in 2006 -History of colon polyp, tubulovillous adenoma, in 2006:  No colonoscopy since that time. -AKI on CKD stage III  PLAN: -Will discuss with Dr. Ardis Hughs regarding endoscopic evaluation.   -Will discontinue PPI gtt and just place on IV BID for now.   ZEHR, JESSICA D.  12/02/2015, 9:10 AM  Pager number [phone]     ________________________________________________________________________  Velora Heckler GI MD note:  I personally examined the patient, reviewed the data and agree with  the assessment and plan described above.  Anorexia, weight loss, dark stools and anemia.  Possibities include PUD, H. Pylori, tumor, other.  I recommended EGD as initial step in workup.  He is not sure but we agreed to put it on the schedule and he will let floor RN know if he decides against proceeding after further thought, discussion with his family.  They want him to have it.   Owens Loffler, MD [facility] Pager [phone]

## 2015-12-02 NOTE — ED Notes (Signed)
Consent signed for administration of blood

## 2015-12-03 ENCOUNTER — Encounter (HOSPITAL_COMMUNITY)
Admission: EM | Disposition: A | Discharge status: Home / Self Care | Payer: Self-pay | Source: Home / Self Care | Attending: Internal Medicine

## 2015-12-03 ENCOUNTER — Encounter (HOSPITAL_COMMUNITY): Payer: Self-pay | Admitting: *Deleted

## 2015-12-03 DIAGNOSIS — K31819 Angiodysplasia of stomach and duodenum without bleeding: Secondary | ICD-10-CM

## 2015-12-03 DIAGNOSIS — K2970 Gastritis, unspecified, without bleeding: Secondary | ICD-10-CM

## 2015-12-03 DIAGNOSIS — E119 Type 2 diabetes mellitus without complications: Secondary | ICD-10-CM

## 2015-12-03 HISTORY — PX: ESOPHAGOGASTRODUODENOSCOPY: SHX5428

## 2015-12-03 LAB — BASIC METABOLIC PANEL
Anion gap: 8 (ref 5–15)
BUN: 26 mg/dL — ABNORMAL HIGH (ref 6–20)
CHLORIDE: 109 mmol/L (ref 101–111)
CO2: 22 mmol/L (ref 22–32)
CREATININE: 1.03 mg/dL (ref 0.61–1.24)
Calcium: 6.2 mg/dL — CL (ref 8.9–10.3)
GFR calc non Af Amer: >60 mL/min (ref >60)
GLUCOSE: 96 mg/dL (ref 65–99)
Potassium: 3.7 mmol/L (ref 3.5–5.1)
Sodium: 139 mmol/L (ref 135–145)

## 2015-12-03 LAB — GLUCOSE, CAPILLARY
GLUCOSE-CAPILLARY: 117 mg/dL — AB (ref 65–99)
GLUCOSE-CAPILLARY: 207 mg/dL — AB (ref 65–99)
GLUCOSE-CAPILLARY: 109 mg/dL — AB (ref 65–99)
Glucose-Capillary: 108 mg/dL — ABNORMAL HIGH (ref 65–99)
Glucose-Capillary: 169 mg/dL — ABNORMAL HIGH (ref 65–99)
Glucose-Capillary: 310 mg/dL — ABNORMAL HIGH (ref 65–99)
Glucose-Capillary: 84 mg/dL (ref 65–99)

## 2015-12-03 LAB — CBC
HCT: 25.7 % — ABNORMAL LOW (ref 39.0–52.0)
HEMOGLOBIN: 8.7 g/dL — AB (ref 13.0–17.0)
MCH: 29.2 pg (ref 26.0–34.0)
MCHC: 33.9 g/dL (ref 30.0–36.0)
MCV: 86.2 fL (ref 78.0–100.0)
Platelets: 193 10*3/uL (ref 150–400)
RBC: 2.98 MIL/uL — AB (ref 4.22–5.81)
RDW: 15.2 % (ref 11.5–15.5)
WBC: 12.1 10*3/uL — ABNORMAL HIGH (ref 4.0–10.5)

## 2015-12-03 LAB — UREA NITROGEN, URINE: UREA NITROGEN UR: 575 mg/dL

## 2015-12-03 SURGERY — EGD (ESOPHAGOGASTRODUODENOSCOPY)
Anesthesia: Moderate Sedation

## 2015-12-03 MED ORDER — SODIUM CHLORIDE 0.9 % IV SOLN
INTRAVENOUS | Status: DC
Start: 2015-12-03 — End: 2015-12-03

## 2015-12-03 MED ORDER — MIDAZOLAM HCL 5 MG/ML IJ SOLN
INTRAMUSCULAR | Status: AC
  Filled 2015-12-03: qty 2

## 2015-12-03 MED ORDER — MIDAZOLAM HCL 10 MG/2ML IJ SOLN
INTRAMUSCULAR | Status: DC | PRN
Start: 2015-12-03 — End: 2015-12-03
  Administered 2015-12-03: 2 mg via INTRAVENOUS
  Administered 2015-12-03: 1 mg via INTRAVENOUS

## 2015-12-03 MED ORDER — PEG-KCL-NACL-NASULF-NA ASC-C 100 G PO SOLR
0.5000 | Freq: Once | ORAL | Status: AC
Start: 2015-12-03 — End: 2015-12-03
  Administered 2015-12-03: 100 g via ORAL
  Filled 2015-12-03: qty 1

## 2015-12-03 MED ORDER — BUTAMBEN-TETRACAINE-BENZOCAINE 2-2-14 % EX AERO
INHALATION_SPRAY | CUTANEOUS | Status: DC | PRN
Start: 2015-12-03 — End: 2015-12-03
  Administered 2015-12-03: 2 via TOPICAL

## 2015-12-03 MED ORDER — FENTANYL CITRATE (PF) 100 MCG/2ML IJ SOLN
INTRAMUSCULAR | Status: AC
  Filled 2015-12-03: qty 4

## 2015-12-03 MED ORDER — PEG-KCL-NACL-NASULF-NA ASC-C 100 G PO SOLR: 1.0000 | Freq: Once | ORAL | Status: DC

## 2015-12-03 MED ORDER — SODIUM CHLORIDE 0.9 % IV SOLN
1.0000 g | Freq: Once | INTRAVENOUS | Status: AC
  Administered 2015-12-03: 1 g via INTRAVENOUS
  Filled 2015-12-03: qty 10

## 2015-12-03 MED ORDER — DIPHENHYDRAMINE HCL 50 MG/ML IJ SOLN
INTRAMUSCULAR | Status: AC
  Filled 2015-12-03: qty 1

## 2015-12-03 MED ORDER — FENTANYL CITRATE (PF) 100 MCG/2ML IJ SOLN
INTRAMUSCULAR | Status: DC | PRN
  Administered 2015-12-03: 25 ug via INTRAVENOUS

## 2015-12-03 MED ORDER — EPINEPHRINE HCL 0.1 MG/ML IJ SOSY
PREFILLED_SYRINGE | INTRAMUSCULAR | Status: AC
  Filled 2015-12-03: qty 10

## 2015-12-03 MED ORDER — PEG-KCL-NACL-NASULF-NA ASC-C 100 G PO SOLR
0.5000 | Freq: Once | ORAL | Status: AC
  Administered 2015-12-04: 100 g via ORAL
  Filled 2015-12-03: qty 1

## 2015-12-03 NOTE — Interval H&P Note (Signed)
History and Physical Interval Note:  12/03/2015 8:30 AM  [name] [name]  has presented today for surgery, with the diagnosis of Black, heme positive stools and symptomatic anemia  The various methods of treatment have been discussed with the patient and family. After consideration of risks, benefits and other options for treatment, the patient has consented to  Procedure(s): ESOPHAGOGASTRODUODENOSCOPY (EGD) (N/A) as a surgical intervention .  The patient's history has been reviewed, patient examined, no change in status, stable for surgery.  I have reviewed the patient's chart and labs.  Questions were answered to the patient's satisfaction.     Milus Banister

## 2015-12-03 NOTE — Op Note (Signed)
[facility] Patient Name: [name] [name] Procedure Date: 12/03/2015 MRN: [id number] Attending MD: Milus Banister , MD Date of Birth: 07/25/1928 CSN: [id number] Age: 82 Admit Type: Inpatient Procedure:                Upper GI endoscopy Indications:              Melena, weight loss, acute on chronic anemia Providers:                Milus Banister, MD, Kingsley Plan, RN,                            William Dalton, Technician Referring MD:              Medicines:                Fentanyl 25 micrograms IV, Midazolam 3 mg IV Complications:            No immediate complications. Estimated blood loss:                            None. Estimated Blood Loss:     Estimated blood loss: none. Procedure:                Pre-Anesthesia Assessment:                           - Prior to the procedure, a History and Physical                            was performed, and patient medications and                            allergies were reviewed. The patient's tolerance of                            previous anesthesia was also reviewed. The risks                            and benefits of the procedure and the sedation                            options and risks were discussed with the patient.                            All questions were answered, and informed consent                            was obtained. Prior Anticoagulants: The patient has                            taken no previous anticoagulant or antiplatelet                            agents. ASA Grade Assessment: III - A patient with  severe systemic disease. After reviewing the risks                            and benefits, the patient was deemed in                            satisfactory condition to undergo the procedure.                           After obtaining informed consent, the endoscope was                            passed under direct vision. Throughout the   procedure, the patient's blood pressure, pulse, and                            oxygen saturations were monitored continuously. The                            [id number] scope was introduced through the                            mouth, and advanced to the second part of duodenum.                            The upper GI endoscopy was accomplished without                            difficulty. The patient tolerated the procedure                            well. Scope In: Scope Out: Findings:      The esophagus was normal.      Moderate inflammation characterized by erosions and erythema was found       in the gastric antrum. Biopsies were taken with a cold forceps for       histology.      One 7 mm no bleeding angiodysplastic lesion was found on the greater       curvature of the gastric body. Coagulation for tissue destruction using       argon plasma was successful.      The examined duodenum was normal. Impression:               - Normal esophagus.                           - Gastritis. Biopsied.                           - One non-bleeding angiodysplastic lesion in the                            stomach. Treated with argon plasma coagulation                            (APC).                           -  Normal examined duodenum. Moderate Sedation:      Moderate (conscious) sedation was administered by the endoscopy nurse       and supervised by the endoscopist. The following parameters were       monitored: oxygen saturation, heart rate, blood pressure, and response       to care. Total physician intraservice time was 10 minutes. Recommendation:           - Return patient to hospital ward for ongoing care.                           - Resume previous diet.                           - Continue present medications.                           - Await pathology results. If the biopsies show H.                            pylori, you will be started on appropriate                             antibiotics.                           - It is NOT clear that these findings account for                            the dark stools, acute on chronic anemia, weight                            loss and I recommend colonoscopy tomorrow since you                            have not had one in 11 years. Procedure Code(s):        --- Professional ---                           [phone], Esophagogastroduodenoscopy, flexible,                            transoral; with ablation of tumor(s), polyp(s), or                            other lesion(s) (includes pre- and post-dilation                            and guide wire passage, when performed)                           43239, Esophagogastroduodenoscopy, flexible,                            transoral; with biopsy, single or multiple  [id number], Moderate sedation services provided by the                            same physician or other qualified health care                            professional performing the diagnostic or                            therapeutic service that the sedation supports,                            requiring the presence of an independent trained                            observer to assist in the monitoring of the                            patient's level of consciousness and physiological                            status; initial 15 minutes of intraservice time,                            patient age 16 years or older Diagnosis Code(s):        --- Professional ---                           K29.70, Gastritis, unspecified, without bleeding                           K31.819, Angiodysplasia of stomach and duodenum                            without bleeding                           K92.1, Melena (includes Hematochezia) CPT copyright 2016 [employer]. All rights reserved. The codes documented in this report are preliminary and upon coder review may  be revised to meet current  compliance requirements. Milus Banister, MD 12/03/2015 8:51:12 AM This report has been signed electronically. Number of Addenda: 0

## 2015-12-03 NOTE — Progress Notes (Signed)
PROGRESS NOTE    [name] [name]  MRN:[id number] DOB: 05/31/1929 DOA: 12/01/2015 PCP: No PCP Per Patient  Brief Narrative: Mr [name] [name] is a 82-year-old gentleman with a past medical history of diabetes mellitus, hypertension, dyslipidemia, having history of GI bleed in 2012 undergoing colonoscopy, presenting to the emergency department on 12/01/2015 with a 2 day history of melena associate with generalized weakness and fatigue. He denied bright red blood per rectum or hematemesis. He was found to be guaiac positive having a hemoglobin of 8.0. He was started on IV Protonix and GI was consulted.                     Assessment & Plan:   Principal Problem:   AKI (acute kidney injury) (HCC) Active Problems:   Hypertension, essential   Non-insulin dependent type 2 diabetes mellitus (HCC)   Hyperlipidemia   Gout of big toe   Normocytic anemia   Hypokalemia   Hypocalcemia   Hypomagnesemia   Occult GI bleeding   Generalized weakness   Lower GI bleed   1.  Suspected upper GI bleed -Mr [name] [name] presents with a 2 day history of black tarry stools, found to be guaiac positive in the emergency department, with lab work showing a downward trend in his hemoglobin to 8.0. -He had been on aspirin therapy, denied other NSAID use or anticoagulants. -GI was consulted for possible upper endoscopy. -Having symptomatic anemia he was transfused with a unit of packed red blood cells. -He underwent EGD on 11/25/2015 that showed nonbleeding angiodysplastic lesion in stomach treated with argon plasma coagulation. Tolerated procedure well there no medial complications.  2.  Acute blood loss anemia. -Lab work showing a downward trend in hemoglobin to 8.0, previously 9.1 day prior. -He presents with complaints of generalized weakness and fatigue. -He was typed and crossed and was transfused 1 unit of packed red blood cells with subsequent improvement to symptoms. -Repeat lab work showing hemoglobin of 8.7  3.   Acute kidney injury.  -Initial lab work showing creatinine of 2.23 with BUN of 46. -Likely secondary to hypovolemia in setting of GI bleed. -A.m. lab work on 11/25/2015 showing improved creatinine of 1.03, down from 1.82  4.  Hypomagnesemia. -Initial lab work revealed a magnesium of 0.9 -Improved to 1.3 on a.m. lab work after receiving IV magnesium replacement. -He was given 2 g of additional IV magnesium  5.  Hypokalemia. -Lab work initially revealed assessment 3.3, improving to 3.7 after replacement -Magnesium is being corrected.  6.  Acute gout -Having improvement to kidney function. -Will treat with colchicine 0.6 mg PO q daily  7.  Diabetes mellitus. -Oral hypoglycemic agents held as he is currently nothing by mouth. -Provide sliding scale coverage every 4 hours.  DVT prophylaxis: SCD's Code Status: Full Code Family Communication:  Disposition Plan: Anticipate discharge in the next 24 hours   Consultants:   GI  Subjective: Denies further episodes of black tarry stools, reports feeling better  Objective: Filed Vitals:   12/03/15 0850 12/03/15 0855 12/03/15 0900 12/03/15 0937  BP: 151/100 155/97 157/97 136/84  Pulse: 88 90 94 86  Temp:    98.1 F (36.7 C)  TempSrc:    Oral  Resp: 26 24 26 22  Height:      Weight:      SpO2: 100% 100% 98% 100%    Intake/Output Summary (Last 24 hours) at 12/03/15 1328 Last data filed at 12/03/15 0657  Gross per 24 hour  Intake      840 ml  Output   1125 ml  Net   -285 ml   Filed Weights   12/01/15 2012 12/02/15 0027  Weight: 90.719 kg (200 lb) 89.8 kg (197 lb 15.6 oz)    Examination:  General exam: Appears calm and comfortable, Nontoxic appearing awake and alert  Respiratory system: Clear to auscultation. Respiratory effort normal. Cardiovascular system: S1 & S2 heard, RRR. No JVD, murmurs, rubs, gallops or clicks. No pedal edema. Gastrointestinal system: Abdomen is nondistended, soft and nontender. No organomegaly or  masses felt. Normal bowel sounds heard. Central nervous system: Alert and oriented. No focal neurological deficits. Extremities: Symmetric 5 x 5 power. Skin: No rashes, lesions or ulcers Psychiatry: Judgement and insight appear normal. Mood & affect appropriate.     Data Reviewed: I have personally reviewed following labs and imaging studies  CBC:  Recent Labs Lab 11/30/15 1457 12/01/15 2056 12/02/15 1020 12/03/15 0510  WBC 12.3* 10.1 11.2* 12.1*  NEUTROABS  --  8.1*  --   --   HGB 9.1* 8.0* 8.5* 8.7*  HCT 26.4* 24.1* 25.4* 25.7*  MCV 87.1 88.3 87.9 86.2  PLT  --  198 180 193   Basic Metabolic Panel:  Recent Labs Lab 11/30/15 1448 12/01/15 2056 12/01/15 2102 12/02/15 0512 12/03/15 0510  NA 142 140  --  142 139  K 4.1 3.3*  --  3.5 3.7  CL 103 103  --  109 109  CO2 19* 23  --  22 22  GLUCOSE 281* 237*  --  203* 96  BUN 39* 46*  --  42* 26*  CREATININE 2.08* 2.23*  --  1.82* 1.03  CALCIUM 6.5* 6.2*  --  6.3* 6.2*  MG  --   --  0.9* 1.3*  --    GFR: Estimated Creatinine Clearance: 58.1 mL/min (by C-G formula based on Cr of 1.03). Liver Function Tests:  Recent Labs Lab 11/30/15 1448 12/01/15 2056 12/02/15 0512  AST 116* 112* 106*  ALT 44 43 41  ALKPHOS 252* 208* 214*  BILITOT 0.8 0.7 0.7  PROT 6.3 6.1* 5.9*  ALBUMIN 3.2* 2.7* 2.7*   No results for input(s): LIPASE, AMYLASE in the last 168 hours. No results for input(s): AMMONIA in the last 168 hours. Coagulation Profile:  Recent Labs Lab 12/01/15 2339  INR 1.20   Cardiac Enzymes: No results for input(s): CKTOTAL, CKMB, CKMBINDEX, TROPONINI in the last 168 hours. BNP (last 3 results) No results for input(s): PROBNP in the last 8760 hours. HbA1C: No results for input(s): HGBA1C in the last 72 hours. CBG:  Recent Labs Lab 12/02/15 1954 12/03/15 0001 12/03/15 0347 12/03/15 0927 12/03/15 1224  GLUCAP 239* 169* 108* 117* 207*   Lipid Profile: No results for input(s): CHOL, HDL, LDLCALC,  TRIG, CHOLHDL, LDLDIRECT in the last 72 hours. Thyroid Function Tests: No results for input(s): TSH, T4TOTAL, FREET4, T3FREE, THYROIDAB in the last 72 hours. Anemia Panel:  Recent Labs  12/01/15 2339  VITAMINB12 3098*  FERRITIN 582*  TIBC 232*  IRON 25*  RETICCTPCT 2.5   Sepsis Labs: No results for input(s): PROCALCITON, LATICACIDVEN in the last 168 hours.  No results found for this or any previous visit (from the past 240 hour(s)).       Radiology Studies: Dg Chest 2 View  12/01/2015  CLINICAL DATA:  Acute onset of bilateral leg weakness. Initial encounter. EXAM: CHEST  2 VIEW COMPARISON:  Chest radiograph performed 09/12/2013 FINDINGS: The lungs are mildly hypoexpanded. There is mild elevation of   the right hemidiaphragm. There is no evidence of focal opacification, pleural effusion or pneumothorax. The heart is borderline normal in size. No acute osseous abnormalities are seen. IMPRESSION: Lungs mildly hypoexpanded but grossly clear, with mild elevation of the right hemidiaphragm. Electronically Signed   By: Jeffery  Chang M.D.   On: 12/01/2015 21:31   Ct Head Wo Contrast  12/01/2015  CLINICAL DATA:  Weakness.  Dizziness for several days. EXAM: CT HEAD WITHOUT CONTRAST TECHNIQUE: Contiguous axial images were obtained from the base of the skull through the vertex without intravenous contrast. COMPARISON:  Head CT 09/12/2013 FINDINGS: No intracranial hemorrhage, mass effect, or midline shift. The degree of atrophy and chronic small vessel ischemia is unchanged from prior exam. No hydrocephalus. The basilar cisterns are patent. No evidence of territorial infarct. No intracranial fluid collection. Atherosclerosis of skullbase vasculature. Calvarium is intact. Included paranasal sinuses and mastoid air cells are well aerated. Improved mucosal thickening of the ethmoid air cells and maxillary sinuses. IMPRESSION: 1.  No acute intracranial abnormality. 2. Atrophy and chronic small vessel  ischemia, unchanged from prior exam. Electronically Signed   By: Melanie  Ehinger M.D.   On: 12/01/2015 21:40        Scheduled Meds: . amLODipine  5 mg Oral Daily  . atorvastatin  20 mg Oral Daily  . colchicine  0.6 mg Oral Daily  . darifenacin  7.5 mg Oral Daily  . insulin aspart  0-15 Units Subcutaneous Q4H  . latanoprost  1 drop Both Eyes QHS  . pantoprazole (PROTONIX) IV  40 mg Intravenous Q12H  . peg 3350 powder  0.5 kit Oral Once   And  . [START ON 12/04/2015] peg 3350 powder  0.5 kit Oral Once  . sodium chloride flush  3 mL Intravenous Q12H  . terazosin  5 mg Oral QHS   Continuous Infusions:    LOS: 2 days   Time spent: 30 min  Cherie Lasalle, MD [facility] Pager [phone]  If 7PM-7AM, please contact night-coverage [email or web address] Password [email or web address] 12/03/2015, 1:28 PM  

## 2015-12-03 NOTE — H&P (View-Only) (Signed)
Referring Provider: No ref. provider found Primary Care Physician:  No PCP Per Patient Primary Gastroenterologist:  ? Dr. Collene Mares  Reason for Consultation:  Anemia and heme positive stool  HPI: [name] [name] is a 82 y.o. male with medical history significant for hypertension, type 2 diabetes mellitus, hyperlipidemia, gout, and pan-diverticulosis with lower GI bleed in 2006 who now presents to the ED with a few days of dark stools.  Says that he's had loss of appetite recently as well and has lost 30-40 pounds but that has been over quite some time.  Reports 2-3 days of generalized weakness and fatigue. Patient says that for the past 3 days or so his stools have been very dark/black in colon.  No red blood noted.  He reports that food no longer has much of the taste.  2-3 days ago he developed an acute gout flare in the left first MTP as well.  He was evaluated for the gout flare at an urgent care and treated effectively with colchicine. Patient denies any abdominal pain, nausea, vomiting, or diarrhea.  Moves his bowels regularly.  Aside from the recent course of colchicine, he denies any new medications or changes in his medications. He takes a daily aspirin 81 mg, but no anticoagulation.  No NSAID's.  Of note, the patient was admitted for a diverticular bleed in 2006, found to have pan-diverticulosis on colonoscopy.  ED Course: Upon arrival to the ED, patient is found to be afebrile, saturating well on room air, tachycardic in the low 100s, and with vitals otherwise stable. CMP features a hypokalemia, serum creatinine of 2.23, up from an apparent baseline of just over 1. Chemistry panels also notable for albumin of 2.7, calcium of 6.2, glucose of 237, and magnesium of 0.9. CBC features a hemoglobin of 8.0 grams, down from 9.1 grams at an urgent care 1 day prior. DRE produced dark stool which was FOBT positive. Type and screen was performed and a 500 cc normal saline bolus was given. Patient remained  hemodynamically stable in the emergency department and was admitted to the hospital for ongoing evaluation and management of acute renal failure with widespread electrolyte derangements and symptomatic anemia.  He received one unit PRBC's this AM.  He has not moved his bowels since admission.    Endoscopy history:  Colonoscopy by Dr. Collene Mares in 03/27/2005 showed diverticular bleed with a visible vessel in the proximal right colon with clips applied and epi injected.  Colonoscopy just five days prior to above, 03/22/2005, showed a polyp that was removed from rectum that was a tubulovillous adenoma.   Past Medical History  Diagnosis Date  . Hypertension   . Diabetes mellitus without complication ([place])   . Gout   . Hyperlipidemia     History reviewed. No pertinent past surgical history.  Prior to Admission medications   Medication Sig Start Date End Date Taking? Authorizing Provider  atorvastatin (LIPITOR) 20 MG tablet Take 20 mg by mouth daily.   Yes Historical Provider, MD  colchicine 0.6 MG tablet Take 2 pills now, then 1 pill in an hour. Wait 3 days before repeating if your gout attack continues. 11/30/15  Yes Jaynee Eagles, PA-C  glipiZIDE (GLUCOTROL XL) 10 MG 24 hr tablet Take 10 mg by mouth daily with breakfast.   Yes Historical Provider, MD  latanoprost (XALATAN) 0.005 % ophthalmic solution Place 1 drop into both eyes at bedtime. 11/13/15  Yes Historical Provider, MD  losartan (COZAAR) 100 MG tablet Take 100 mg  by mouth every morning.    Yes Historical Provider, MD  metFORMIN (GLUCOPHAGE) 500 MG tablet Take 1,000 mg by mouth every evening.    Yes Historical Provider, MD  PRESCRIPTION MEDICATION Sample of Janumet- unsure of strength.   Yes Historical Provider, MD  saxagliptin HCl (ONGLYZA) 5 MG TABS tablet Take 5 mg by mouth daily.   Yes Historical Provider, MD  terazosin (HYTRIN) 5 MG capsule Take 5 mg by mouth at bedtime.    Yes Historical Provider, MD  VESICARE 5 MG tablet Take 5 mg by  mouth daily. 11/27/15  Yes Historical Provider, MD    Current Facility-Administered Medications  Medication Dose Route Frequency Provider Last Rate Last Dose  . 0.9 %  sodium chloride infusion   Intravenous Continuous Vianne Bulls, MD 75 mL/hr at 12/02/15 0046    . acetaminophen (TYLENOL) tablet 650 mg  650 mg Oral Q6H PRN Vianne Bulls, MD       Or  . acetaminophen (TYLENOL) suppository 650 mg  650 mg Rectal Q6H PRN Vianne Bulls, MD      . atorvastatin (LIPITOR) tablet 20 mg  20 mg Oral Daily Ilene Qua Opyd, MD      . darifenacin (ENABLEX) 24 hr tablet 7.5 mg  7.5 mg Oral Daily Ilene Qua Opyd, MD      . HYDROcodone-acetaminophen (NORCO/VICODIN) 5-325 MG per tablet 1-2 tablet  1-2 tablet Oral Q4H PRN Ilene Qua Opyd, MD      . insulin aspart (novoLOG) injection 0-15 Units  0-15 Units Subcutaneous TID WC Vianne Bulls, MD   3 Units at 12/02/15 [phone]  . latanoprost (XALATAN) 0.005 % ophthalmic solution 1 drop  1 drop Both Eyes QHS Vianne Bulls, MD   1 drop at 12/02/15 0113  . ondansetron (ZOFRAN) tablet 4 mg  4 mg Oral Q6H PRN Vianne Bulls, MD       Or  . ondansetron (ZOFRAN) injection 4 mg  4 mg Intravenous Q6H PRN Vianne Bulls, MD      . pantoprazole (PROTONIX) 80 mg in sodium chloride 0.9 % 250 mL (0.32 mg/mL) infusion  8 mg/hr Intravenous Continuous Vianne Bulls, MD 25 mL/hr at 12/02/15 0128 8 mg/hr at 12/02/15 0128  . [START ON 12/05/2015] pantoprazole (PROTONIX) injection 40 mg  40 mg Intravenous Q12H Timothy S Opyd, MD      . predniSONE (DELTASONE) tablet 40 mg  40 mg Oral Q breakfast Vianne Bulls, MD   40 mg at 12/02/15 0824  . sodium chloride flush (NS) 0.9 % injection 3 mL  3 mL Intravenous Q12H Vianne Bulls, MD   3 mL at 12/02/15 0045  . terazosin (HYTRIN) capsule 5 mg  5 mg Oral QHS Ilene Qua Opyd, MD   5 mg at 12/02/15 0128    Allergies as of 12/01/2015 - Review Complete 12/01/2015  Allergen Reaction Noted  . Ace inhibitors  09/12/2013  . Capoten [captopril]   09/12/2013  . Glucophage [metformin]  09/12/2013  . Vasotec [enalapril]  09/12/2013    History reviewed. No pertinent family history.  Social History   Social History  . Marital Status: Married    Spouse Name: N/A  . Number of Children: N/A  . Years of Education: N/A   Occupational History  . Not on file.   Social History Main Topics  . Smoking status: Former Research scientist (life sciences)  . Smokeless tobacco: Not on file  . Alcohol Use: No  . Drug Use: No  .  Sexual Activity: Not on file   Other Topics Concern  . Not on file   Social History Narrative    Review of Systems: Ten point ROS is O/W negative except as mentioned in HPI.  Physical Exam: Vital signs in last 24 hours: Temp:  [97.9 F (36.6 C)-99.4 F (37.4 C)] 99 F (37.2 C) (05/27 0620) Pulse Rate:  [94-106] 96 (05/27 0620) Resp:  [20-28] 24 (05/27 0620) BP: (128-154)/(69-96) 147/95 mmHg (05/27 0620) SpO2:  [97 %-100 %] 98 % (05/27 0620) Weight:  [197 lb 15.6 oz (89.8 kg)-200 lb (90.719 kg)] 197 lb 15.6 oz (89.8 kg) (05/27 0027) Last BM Date: 12/02/15 General:  Alert, Well-developed, well-nourished, pleasant and cooperative in NAD Head:  Normocephalic and atraumatic. Eyes:  Sclera clear, no icterus.  Conjunctiva pink. Ears:  Normal auditory acuity. Mouth:  No deformity or lesions.   Lungs:  Clear throughout to auscultation.  No wheezes, crackles, or rhonchi.  Heart:  Regular rate and rhythm; no murmurs, clicks, rubs, or gallops. Abdomen:  Soft, non-distended.  BS present.  Non-tender.  Rectal:  Deferred.  Was heme positive. Msk:  Symmetrical without gross deformities. Pulses:  Normal pulses noted. Extremities:  Without clubbing or edema. Neurologic:  Alert and oriented x 4;  grossly normal neurologically. Skin:  Intact without significant lesions or rashes. Psych:  Alert and cooperative. Normal mood and affect.  Intake/Output from previous day: 05/26 0701 - 05/27 0700 In: 30 [Blood:30] Out: 100 [Urine:100]  Lab  Results:  Recent Labs  11/30/15 1457 12/01/15 2056  WBC 12.3* 10.1  HGB 9.1* 8.0*  HCT 26.4* 24.1*  PLT  --  198   BMET  Recent Labs  11/30/15 1448 12/01/15 2056 12/02/15 0512  NA 142 140 142  K 4.1 3.3* 3.5  CL 103 103 109  CO2 19* 23 22  GLUCOSE 281* 237* 203*  BUN 39* 46* 42*  CREATININE 2.08* 2.23* 1.82*  CALCIUM 6.5* 6.2* 6.3*   LFT  Recent Labs  12/02/15 0512  PROT 5.9*  ALBUMIN 2.7*  AST 106*  ALT 41  ALKPHOS 214*  BILITOT 0.7   PT/INR  Recent Labs  12/01/15 2339  LABPROT 15.4*  INR 1.20   Studies/Results: Dg Chest 2 View  12/01/2015  CLINICAL DATA:  Acute onset of bilateral leg weakness. Initial encounter. EXAM: CHEST  2 VIEW COMPARISON:  Chest radiograph performed 09/12/2013 FINDINGS: The lungs are mildly hypoexpanded. There is mild elevation of the right hemidiaphragm. There is no evidence of focal opacification, pleural effusion or pneumothorax. The heart is borderline normal in size. No acute osseous abnormalities are seen. IMPRESSION: Lungs mildly hypoexpanded but grossly clear, with mild elevation of the right hemidiaphragm. Electronically Signed   By: Garald Balding M.D.   On: 12/01/2015 21:31   Ct Head Wo Contrast  12/01/2015  CLINICAL DATA:  Weakness.  Dizziness for several days. EXAM: CT HEAD WITHOUT CONTRAST TECHNIQUE: Contiguous axial images were obtained from the base of the skull through the vertex without intravenous contrast. COMPARISON:  Head CT 09/12/2013 FINDINGS: No intracranial hemorrhage, mass effect, or midline shift. The degree of atrophy and chronic small vessel ischemia is unchanged from prior exam. No hydrocephalus. The basilar cisterns are patent. No evidence of territorial infarct. No intracranial fluid collection. Atherosclerosis of skullbase vasculature. Calvarium is intact. Included paranasal sinuses and mastoid air cells are well aerated. Improved mucosal thickening of the ethmoid air cells and maxillary sinuses. IMPRESSION:  1.  No acute intracranial abnormality. 2. Atrophy and chronic  small vessel ischemia, unchanged from prior exam. Electronically Signed   By: Jeb Levering M.D.   On: 12/01/2015 21:40   Dg Foot 2 Views Left  11/30/2015  CLINICAL DATA:  Swollen, painful left great toe EXAM: LEFT FOOT - 2 VIEW COMPARISON:  None. FINDINGS: Tarsal-metatarsal alignment is normal. No fracture is seen. No definite erosion is noted. Joint spaces are relatively well preserved for age. There is faint opacity within the soft tissues adjacent to the left first metatarsal head which could represent faint calcification possibly indicative of gout. There is pes planus present. There are degenerative changes in the midfoot with degenerative calcaneal spurs also noted. IMPRESSION: 1. No acute abnormality. 2. Questionable faint calcification in the soft tissues adjacent to the left first MTP joint could indicate gout but no erosions are noted. 3. Pes planus with degenerative changes in the mid and hindfoot. Electronically Signed   By: Ivar Drape M.D.   On: 11/30/2015 15:12   IMPRESSION:  -82 year old male with reports of recent dizziness, weakness, fatigue and associated dark stools at home.  Was heme negative and found to have Hgb of 8.0 grams, which is compared to 9.1 grams just one day prior.  Was admitted for symptomatic, acute on chronic, anemia due to GI bleed.  Received one unit PRBC's this AM.  Is on PPI gtt. -History of diverticular bleed in 2006 -History of colon polyp, tubulovillous adenoma, in 2006:  No colonoscopy since that time. -AKI on CKD stage III  PLAN: -Will discuss with Dr. Ardis Hughs regarding endoscopic evaluation.   -Will discontinue PPI gtt and just place on IV BID for now.   [name] [name], [name] D.  12/02/2015, 9:10 AM  Pager number [phone]     ________________________________________________________________________  Velora Heckler GI MD note:  I personally examined the patient, reviewed the data and agree with  the assessment and plan described above.  Anorexia, weight loss, dark stools and anemia.  Possibities include PUD, H. Pylori, tumor, other.  I recommended EGD as initial step in workup.  He is not sure but we agreed to put it on the schedule and he will let floor RN know if he decides against proceeding after further thought, discussion with his family.  They want him to have it.   [name] Loffler, MD [facility] Pager [phone]

## 2015-12-04 ENCOUNTER — Encounter (HOSPITAL_COMMUNITY): Payer: Self-pay | Admitting: Gastroenterology

## 2015-12-04 ENCOUNTER — Encounter (HOSPITAL_COMMUNITY)
Admission: EM | Disposition: A | Discharge status: Home / Self Care | Payer: Self-pay | Source: Home / Self Care | Attending: Internal Medicine

## 2015-12-04 DIAGNOSIS — K922 Gastrointestinal hemorrhage, unspecified: Secondary | ICD-10-CM

## 2015-12-04 DIAGNOSIS — E876 Hypokalemia: Secondary | ICD-10-CM

## 2015-12-04 DIAGNOSIS — D120 Benign neoplasm of cecum: Secondary | ICD-10-CM

## 2015-12-04 DIAGNOSIS — K5730 Diverticulosis of large intestine without perforation or abscess without bleeding: Secondary | ICD-10-CM

## 2015-12-04 HISTORY — PX: COLONOSCOPY: SHX5424

## 2015-12-04 LAB — CBC
HEMATOCRIT: 27.8 % — AB (ref 39.0–52.0)
HEMOGLOBIN: 9.4 g/dL — AB (ref 13.0–17.0)
MCH: 28.7 pg (ref 26.0–34.0)
MCHC: 33.8 g/dL (ref 30.0–36.0)
MCV: 84.8 fL (ref 78.0–100.0)
Platelets: 215 10*3/uL (ref 150–400)
RBC: 3.28 MIL/uL — ABNORMAL LOW (ref 4.22–5.81)
RDW: 15.4 % (ref 11.5–15.5)
WBC: 10.4 10*3/uL (ref 4.0–10.5)

## 2015-12-04 LAB — TYPE AND SCREEN
ABO/RH(D): O POS
ANTIBODY SCREEN: NEGATIVE
Unit division: 0

## 2015-12-04 LAB — GLUCOSE, CAPILLARY
GLUCOSE-CAPILLARY: 145 mg/dL — AB (ref 65–99)
GLUCOSE-CAPILLARY: 117 mg/dL — AB (ref 65–99)

## 2015-12-04 SURGERY — COLONOSCOPY
Anesthesia: Moderate Sedation

## 2015-12-04 MED ORDER — MIDAZOLAM HCL 5 MG/5ML IJ SOLN
INTRAMUSCULAR | Status: DC | PRN
  Administered 2015-12-04 (×2): 2 mg via INTRAVENOUS

## 2015-12-04 MED ORDER — MIDAZOLAM HCL 5 MG/ML IJ SOLN
INTRAMUSCULAR | Status: AC
  Filled 2015-12-04: qty 2

## 2015-12-04 MED ORDER — PANTOPRAZOLE SODIUM 40 MG PO TBEC: 40.0000 mg | DELAYED_RELEASE_TABLET | Freq: Every day | ORAL | Status: DC

## 2015-12-04 MED ORDER — FENTANYL CITRATE (PF) 100 MCG/2ML IJ SOLN
INTRAMUSCULAR | Status: AC
  Filled 2015-12-04: qty 2

## 2015-12-04 MED ORDER — FENTANYL CITRATE (PF) 100 MCG/2ML IJ SOLN
INTRAMUSCULAR | Status: DC | PRN
  Administered 2015-12-04: 25 ug via INTRAVENOUS
  Administered 2015-12-04: 12.5 ug via INTRAVENOUS

## 2015-12-04 NOTE — H&P (View-Only) (Signed)
PROGRESS NOTE    [name] [name]  BOF:[id number] DOB: 29-Nov-1928 DOA: 12/01/2015 PCP: No PCP Per Patient  Brief Narrative: Mr [name] [name] is an 82 year old gentleman with a past medical history of diabetes mellitus, hypertension, dyslipidemia, having history of GI bleed in 2012 undergoing colonoscopy, presenting to the emergency department on 12/01/2015 with a 2 day history of melena associate with generalized weakness and fatigue. He denied bright red blood per rectum or hematemesis. He was found to be guaiac positive having a hemoglobin of 8.0. He was started on IV Protonix and GI was consulted.                     Assessment & Plan:   Principal Problem:   AKI (acute kidney injury) ([place]) Active Problems:   Hypertension, essential   Non-insulin dependent type 2 diabetes mellitus (HCC)   Hyperlipidemia   Gout of big toe   Normocytic anemia   Hypokalemia   Hypocalcemia   Hypomagnesemia   Occult GI bleeding   Generalized weakness   Lower GI bleed   1.  Suspected upper GI bleed -Mr [name] [name] presents with a 2 day history of black tarry stools, found to be guaiac positive in the emergency department, with lab work showing a downward trend in his hemoglobin to 8.0. -He had been on aspirin therapy, denied other NSAID use or anticoagulants. -GI was consulted for possible upper endoscopy. -Having symptomatic anemia he was transfused with a unit of packed red blood cells. -He underwent EGD on 11/25/2015 that showed nonbleeding angiodysplastic lesion in stomach treated with argon plasma coagulation. Tolerated procedure well there no medial complications.  2.  Acute blood loss anemia. -Lab work showing a downward trend in hemoglobin to 8.0, previously 9.1 day prior. -He presents with complaints of generalized weakness and fatigue. -He was typed and crossed and was transfused 1 unit of packed red blood cells with subsequent improvement to symptoms. -Repeat lab work showing hemoglobin of 8.7  3.   Acute kidney injury.  -Initial lab work showing creatinine of 2.23 with BUN of 46. -Likely secondary to hypovolemia in setting of GI bleed. -A.m. lab work on 11/25/2015 showing improved creatinine of 1.03, down from 1.82  4.  Hypomagnesemia. -Initial lab work revealed a magnesium of 0.9 -Improved to 1.3 on a.m. lab work after receiving IV magnesium replacement. -He was given 2 g of additional IV magnesium  5.  Hypokalemia. -Lab work initially revealed assessment 3.3, improving to 3.7 after replacement -Magnesium is being corrected.  6.  Acute gout -Having improvement to kidney function. -Will treat with colchicine 0.6 mg PO q daily  7.  Diabetes mellitus. -Oral hypoglycemic agents held as he is currently nothing by mouth. -Provide sliding scale coverage every 4 hours.  DVT prophylaxis: SCD's Code Status: Full Code Family Communication:  Disposition Plan: Anticipate discharge in the next 24 hours   Consultants:   GI  Subjective: Denies further episodes of black tarry stools, reports feeling better  Objective: Filed Vitals:   12/03/15 0850 12/03/15 0855 12/03/15 0900 12/03/15 0937  BP: 151/100 155/97 157/97 136/84  Pulse: 88 90 94 86  Temp:    98.1 F (36.7 C)  TempSrc:    Oral  Resp: _0 Height:      Weight:      SpO2: 100% 100% 98% 100%    Intake/Output Summary (Last 24 hours) at 12/03/15 1328 Last data filed at 12/03/15 0657  Gross per 24 hour  Intake  840 ml  Output   1125 ml  Net   -285 ml   Filed Weights   12/01/15 2012 12/02/15 0027  Weight: 90.719 kg (200 lb) 89.8 kg (197 lb 15.6 oz)    Examination:  General exam: Appears calm and comfortable, Nontoxic appearing awake and alert  Respiratory system: Clear to auscultation. Respiratory effort normal. Cardiovascular system: S1 & S2 heard, RRR. No JVD, murmurs, rubs, gallops or clicks. No pedal edema. Gastrointestinal system: Abdomen is nondistended, soft and nontender. No organomegaly or  masses felt. Normal bowel sounds heard. Central nervous system: Alert and oriented. No focal neurological deficits. Extremities: Symmetric 5 x 5 power. Skin: No rashes, lesions or ulcers Psychiatry: Judgement and insight appear normal. Mood & affect appropriate.     Data Reviewed: I have personally reviewed following labs and imaging studies  CBC:  Recent Labs Lab 11/30/15 1457 12/01/15 2056 12/02/15 1020 12/03/15 0510  WBC 12.3* 10.1 11.2* 12.1*  NEUTROABS  --  8.1*  --   --   HGB 9.1* 8.0* 8.5* 8.7*  HCT 26.4* 24.1* 25.4* 25.7*  MCV 87.1 88.3 87.9 86.2  PLT  --  198 180 053   Basic Metabolic Panel:  Recent Labs Lab 11/30/15 1448 12/01/15 2056 12/01/15 2102 12/02/15 0512 12/03/15 0510  NA 142 140  --  142 139  K 4.1 3.3*  --  3.5 3.7  CL 103 103  --  109 109  CO2 19* 23  --  22 22  GLUCOSE 281* 237*  --  203* 96  BUN 39* 46*  --  42* 26*  CREATININE 2.08* 2.23*  --  1.82* 1.03  CALCIUM 6.5* 6.2*  --  6.3* 6.2*  MG  --   --  0.9* 1.3*  --    GFR: Estimated Creatinine Clearance: 58.1 mL/min (by C-G formula based on Cr of 1.03). Liver Function Tests:  Recent Labs Lab 11/30/15 1448 12/01/15 2056 12/02/15 0512  AST 116* 112* 106*  ALT 44 43 41  ALKPHOS 252* 208* 214*  BILITOT 0.8 0.7 0.7  PROT 6.3 6.1* 5.9*  ALBUMIN 3.2* 2.7* 2.7*   No results for input(s): LIPASE, AMYLASE in the last 168 hours. No results for input(s): AMMONIA in the last 168 hours. Coagulation Profile:  Recent Labs Lab 12/01/15 2339  INR 1.20   Cardiac Enzymes: No results for input(s): CKTOTAL, CKMB, CKMBINDEX, TROPONINI in the last 168 hours. BNP (last 3 results) No results for input(s): PROBNP in the last 8760 hours. HbA1C: No results for input(s): HGBA1C in the last 72 hours. CBG:  Recent Labs Lab 12/02/15 1954 12/03/15 0001 12/03/15 0347 12/03/15 0927 12/03/15 1224  GLUCAP 239* 169* 108* 117* 207*   Lipid Profile: No results for input(s): CHOL, HDL, LDLCALC,  TRIG, CHOLHDL, LDLDIRECT in the last 72 hours. Thyroid Function Tests: No results for input(s): TSH, T4TOTAL, FREET4, T3FREE, THYROIDAB in the last 72 hours. Anemia Panel:  Recent Labs  12/01/15 2339  VITAMINB12 3098*  FERRITIN 582*  TIBC 232*  IRON 25*  RETICCTPCT 2.5   Sepsis Labs: No results for input(s): PROCALCITON, LATICACIDVEN in the last 168 hours.  No results found for this or any previous visit (from the past 240 hour(s)).       Radiology Studies: Dg Chest 2 View  12/01/2015  CLINICAL DATA:  Acute onset of bilateral leg weakness. Initial encounter. EXAM: CHEST  2 VIEW COMPARISON:  Chest radiograph performed 09/12/2013 FINDINGS: The lungs are mildly hypoexpanded. There is mild elevation of  the right hemidiaphragm. There is no evidence of focal opacification, pleural effusion or pneumothorax. The heart is borderline normal in size. No acute osseous abnormalities are seen. IMPRESSION: Lungs mildly hypoexpanded but grossly clear, with mild elevation of the right hemidiaphragm. Electronically Signed   By: Garald Balding M.D.   On: 12/01/2015 21:31   Ct Head Wo Contrast  12/01/2015  CLINICAL DATA:  Weakness.  Dizziness for several days. EXAM: CT HEAD WITHOUT CONTRAST TECHNIQUE: Contiguous axial images were obtained from the base of the skull through the vertex without intravenous contrast. COMPARISON:  Head CT 09/12/2013 FINDINGS: No intracranial hemorrhage, mass effect, or midline shift. The degree of atrophy and chronic small vessel ischemia is unchanged from prior exam. No hydrocephalus. The basilar cisterns are patent. No evidence of territorial infarct. No intracranial fluid collection. Atherosclerosis of skullbase vasculature. Calvarium is intact. Included paranasal sinuses and mastoid air cells are well aerated. Improved mucosal thickening of the ethmoid air cells and maxillary sinuses. IMPRESSION: 1.  No acute intracranial abnormality. 2. Atrophy and chronic small vessel  ischemia, unchanged from prior exam. Electronically Signed   By: Jeb Levering M.D.   On: 12/01/2015 21:40        Scheduled Meds: . amLODipine  5 mg Oral Daily  . atorvastatin  20 mg Oral Daily  . colchicine  0.6 mg Oral Daily  . darifenacin  7.5 mg Oral Daily  . insulin aspart  0-15 Units Subcutaneous Q4H  . latanoprost  1 drop Both Eyes QHS  . pantoprazole (PROTONIX) IV  40 mg Intravenous Q12H  . peg 3350 powder  0.5 kit Oral Once   And  . [START ON 12/04/2015] peg 3350 powder  0.5 kit Oral Once  . sodium chloride flush  3 mL Intravenous Q12H  . terazosin  5 mg Oral QHS   Continuous Infusions:    LOS: 2 days   Time spent: 30 min  Kelvin Cellar, MD [facility] Pager [phone]  If 7PM-7AM, please contact night-coverage [email or web address] Password [email or web address] 12/03/2015, 1:28 PM

## 2015-12-04 NOTE — Progress Notes (Signed)
Utilization review completed.  

## 2015-12-04 NOTE — Op Note (Signed)
[facility] Patient Name: [name] [name] November Procedure Date: 12/04/2015 MRN: [id number] Attending MD: Milus Banister , MD Date of Birth: 11-25-1928 CSN: [id number] Age: 82 Admit Type: Inpatient Procedure:                Colonoscopy Indications:              Heme positive stool, anemia; admitting Hb 9.1 and 1                            year ago it was 10. 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 Estimated blood loss:                            None. Estimated Blood Loss:     Estimated blood loss: none. Procedure:                Pre-Anesthesia Assessment:                           - Prior to the procedure, a History and Physical                            was performed, and patient medications and                            allergies were reviewed. The patient's tolerance of                            previous anesthesia was also reviewed. The risks                            and benefits of the procedure and the sedation                            options and risks were discussed with the patient.                            All questions were answered, and informed consent  was obtained. Prior Anticoagulants: The patient has                            taken no previous anticoagulant or antiplatelet                            agents. ASA Grade Assessment: III - A patient with                            severe systemic disease. After reviewing the risks                            and benefits, the patient was deemed in                            satisfactory  condition to undergo the procedure.                           After obtaining informed consent, the colonoscope                            was passed under direct vision. Throughout the                            procedure, the patient's blood pressure, pulse, and                            oxygen saturations were monitored continuously. The                            [id number]) scope was introduced through                            the anus and advanced to the the cecum, identified                            by appendiceal orifice and ileocecal valve. The                            colonoscopy was performed without difficulty. The                            patient tolerated the procedure well. The quality                            of the bowel preparation was good. The ileocecal                            valve, appendiceal orifice, and rectum were                            photographed. Scope In: 8:30:36 AM Scope Out: 8:51:14 AM Scope Withdrawal Time: 0 hours 8 minutes 10 seconds  Total Procedure Duration: 0 hours  20 minutes 38 seconds  Findings:      A 17 mm polyp was found in the cecum. The polyp was pedunculated and       slightly irregular appearing at its tip (?advanced polyp). The polyp was       removed with a hot snare. Resection and retrieval were complete.      Many small-mouthed diverticula were found in the left colon.      The exam was otherwise without abnormality on direct and retroflexion       views. Impression:               - One 17 mm polyp in the cecum, removed with a hot                            snare. Resected and retrieved. This may habor                            advanced neoplasia given slightly irregular                            appearance at its tip.                           - Diverticulosis in the left colon.                           - The examination was otherwise normal on direct                            and retroflexion  views. Moderate Sedation:      Moderate (conscious) sedation was administered by the endoscopy nurse       and supervised by the endoscopist. The following parameters were       monitored: oxygen saturation, heart rate, blood pressure, and response       to care. Total physician intraservice time was 20 minutes. Recommendation:           - Return patient to hospital ward for possible                            discharge same day.                           - Resume previous diet.                           - Continue present medications.                           - Await pathology results from yesterday's EGD and                            this colonoscopy today. Procedure Code(s):        --- Professional ---                           [phone], Colonoscopy, flexible; with removal of  tumor(s), polyp(s), or other lesion(s) by snare                            technique                           99152, Moderate sedation services provided by the                            same physician or other qualified health care                            professional performing the diagnostic or                            therapeutic service that the sedation supports,                            requiring the presence of an independent trained                            observer to assist in the monitoring of the                            patient's level of consciousness and physiological                            status; initial 15 minutes of intraservice time,                            patient age 70 years or older Diagnosis Code(s):        --- Professional ---                           D12.0, Benign neoplasm of cecum                           R19.5, Other fecal abnormalities                           K57.30, Diverticulosis of large intestine without                            perforation or abscess without bleeding CPT copyright 2016 [employer]. All rights  reserved. The codes documented in this report are preliminary and upon coder review may  be revised to meet current compliance requirements. Milus Banister, MD 12/04/2015 9:01:54 AM This report has been signed electronically. Number of Addenda: 0

## 2015-12-04 NOTE — Interval H&P Note (Signed)
History and Physical Interval Note:  12/04/2015 8:17 AM  [name] [name]  has presented today for surgery, with the diagnosis of melena, anemia, weight loss  The various methods of treatment have been discussed with the patient and family. After consideration of risks, benefits and other options for treatment, the patient has consented to  Procedure(s): COLONOSCOPY (N/A) as a surgical intervention .  The patient's history has been reviewed, patient examined, no change in status, stable for surgery.  I have reviewed the patient's chart and labs.  Questions were answered to the patient's satisfaction.     Milus Banister

## 2015-12-04 NOTE — Discharge Summary (Signed)
Physician Discharge Summary  [name] [name] [id number] DOB: Sep 30, 1928 DOA: 12/01/2015  PCP: No PCP Per Patient  Admit date: 12/01/2015 Discharge date: 12/04/2015  Time spent: 35 minutes  Recommendations for Outpatient Follow-up:  1. Please follow up on CBC on hospital follow up visit, he was admitted for upper GI bleed, required blood transfusion, had a Hg of 9.4 on day of discharge.  2. BMP on hospital follow up since he presented with AKI, renal failure resolving on day of discharge.  3. Follow up on pathology report from polyp removal   Discharge Diagnoses:  Principal Problem:   AKI (acute kidney injury) ([place]) Active Problems:   Hypertension, essential   Non-insulin dependent type 2 diabetes mellitus ([place])   Hyperlipidemia   Gout of big toe   Normocytic anemia   Hypokalemia   Hypocalcemia   Hypomagnesemia   Occult GI bleeding   Generalized weakness   Lower GI bleed   Discharge Condition: Stable  Diet recommendation: Heart Healthy  [facility] Weights   12/01/15 2012 12/02/15 0027  Weight: 90.719 kg (200 lb) 89.8 kg (197 lb 15.6 oz)    History of present illness:  [name] [name] is a 82 y.o. male with medical history significant for hypertension, type 2 diabetes mellitus, hyperlipidemia, gout, and pan-diverticulosis with lower GI bleed in 2006 who now presents to the ED with 2 weeks of melena and loss of appetite, and 2-3 days of generalized weakness and fatigue. Patient reports pain in his usual state of health until approximately 2 weeks, he noticed that his stools had become very dark. Around that same time, he developed a loss of appetite, reporting that food no longer has much of the taste. His condition persisted largely unchanged until 2-3 days ago when he developed an acute gout flare in the left first MTP as well as generalized weakness and fatigue. He was evaluated for the gout flare at an urgent care and treated effectively with colchicine. Patient denies any  abdominal pain, nausea, vomiting, or diarrhea. Aside from the recent course of colchicine, he denies any new medications or changes in his medications. He takes a daily aspirin 81 mg, but no anticoagulation. He denies any headaches, focal numbness or weakness, or loss of coordination. Of note, the patient was admitted for a diverticular bleed in 2006, found to have pan-diverticulosis on colonoscopy, and a surgical consultant felt that he may at some point require colectomy. There is a history of a 12 cm tubulous adenoma removed endoscopically in 2006 with no atypia on pathology report.  Hospital Course:  [name] [name] is a 82 year old gentleman with a past medical history of diabetes mellitus, hypertension, dyslipidemia, having history of GI bleed in 2012 undergoing colonoscopy, presenting to the emergency department on 12/01/2015 with a 2 day history of melena associate with generalized weakness and fatigue. He denied bright red blood per rectum or hematemesis. He was found to be guaiac positive having a hemoglobin of 8.0. He was started on IV Protonix and GI was consulted.  1. Suspected upper GI bleed -[name] [name] presents with a 80 day history of black tarry stools, found to be guaiac positive in the emergency department, with lab work showing a downward trend in his hemoglobin to 8.0. -He had been on aspirin therapy, denied other NSAID use or anticoagulants. -GI was consulted for possible upper endoscopy. -Having symptomatic anemia he was transfused with a unit of packed red blood cells. -He underwent EGD on 11/25/2015 that showed nonbleeding angiodysplastic lesion in stomach  treated with argon plasma coagulation. Tolerated procedure well there no medial complications. -On [id number] he underwent colonoscopy with removal of 17 mm polyp, tolerated procedure well. Will need follow up on pathology.   2. Acute blood loss anemia. -Lab work showing a downward trend in hemoglobin to 8.0, previously  9.1 day prior. -He presents with complaints of generalized weakness and fatigue. -He was typed and crossed and was transfused 1 unit of packed red blood cells with subsequent improvement to symptoms. -Repeat lab work showing hemoglobin of 9.4  3. Acute kidney injury.  -Initial lab work showing creatinine of 2.23 with BUN of 46. -Likely secondary to hypovolemia in setting of GI bleed. -A.m. lab work on 11/25/2015 showing improved creatinine of 1.03, down from 1.82  4. Hypomagnesemia. -Initial lab work revealed a magnesium of 0.9 -Improved to 1.3 on a.m. lab work after receiving IV magnesium replacement. -He was given 2 g of additional IV magnesium  5. Hypokalemia. -Lab work initially revealed assessment 3.3, improving to 3.7 after replacement -Magnesium is being corrected.  6. Acute gout -Having improvement to kidney function. -Treated with colchicine 0.6 mg PO q daily  7. Diabetes mellitus. -Was discharged on him home regimen   Procedures:  EGD performed on 12/03/2015  Impression:- Normal esophagus.  - Gastritis. Biopsied.  - One non-bleeding angiodysplastic lesion in the   stomach. Treated with argon plasma coagulation   (APC).  - Normal examined duodenum.   Colonoscopy performed on 12/03/2015 Impression: - One 17 mm polyp in the cecum, removed with a hot   snare. Resected and retrieved. This may habor   advanced neoplasia given slightly irregular   appearance at its tip.  - Diverticulosis in the left colon.  - The examination was otherwise normal on direct   and retroflexion views.  Consultations:  Dr Ardis Hughs GI  Discharge Exam: Filed Vitals:   12/04/15 0905 12/04/15 0910   BP: 154/93   Pulse: 95 94  Temp:    Resp: 20 24    General: Appears to be doing well, ambulated down the hallway with a walker, wants to go home today Cardiovascular: RRR, NL S1S2 Respiratory: NL inspiratory effort, lungs clear to auscultation b/l ABD: Soft, nontender nondistended  Discharge Instructions   Discharge Instructions    Call MD for:  difficulty breathing, headache or visual disturbances    Complete by:  As directed      Call MD for:  extreme fatigue    Complete by:  As directed      Call MD for:  hives    Complete by:  As directed      Call MD for:  persistant dizziness or light-headedness    Complete by:  As directed      Call MD for:  persistant nausea and vomiting    Complete by:  As directed      Call MD for:  redness, tenderness, or signs of infection (pain, swelling, redness, odor or green/yellow discharge around incision site)    Complete by:  As directed      Call MD for:  severe uncontrolled pain    Complete by:  As directed      Call MD for:  temperature >100.4    Complete by:  As directed      Call MD for:    Complete by:  As directed      Diet - low sodium heart healthy    Complete by:  As directed      Increase  activity slowly    Complete by:  As directed           Current Discharge Medication List    START taking these medications   Details  pantoprazole (PROTONIX) 40 MG tablet Take 1 tablet (40 mg total) by mouth daily. Qty: 30 tablet, Refills: 1      CONTINUE these medications which have NOT CHANGED   Details  atorvastatin (LIPITOR) 20 MG tablet Take 20 mg by mouth daily.    colchicine 0.6 MG tablet Take 2 pills now, then 1 pill in an hour. Wait 3 days before repeating if your gout attack continues. Qty: 30 tablet, Refills: 0   Associated Diagnoses: Acute gout of left foot, unspecified cause    glipiZIDE (GLUCOTROL XL) 10 MG 24 hr tablet Take 10 mg by mouth daily with breakfast.    latanoprost (XALATAN) 0.005 % ophthalmic  solution Place 1 drop into both eyes at bedtime. Refills: 1    losartan (COZAAR) 100 MG tablet Take 100 mg by mouth every morning.     metFORMIN (GLUCOPHAGE) 500 MG tablet Take 1,000 mg by mouth every evening.     saxagliptin HCl (ONGLYZA) 5 MG TABS tablet Take 5 mg by mouth daily.    terazosin (HYTRIN) 5 MG capsule Take 5 mg by mouth at bedtime.     VESICARE 5 MG tablet Take 5 mg by mouth daily. Refills: 0      STOP taking these medications     PRESCRIPTION MEDICATION        Allergies  Allergen Reactions  . Ace Inhibitors     Listed on allergy list in wallet; unk reason per pt  . Capoten [Captopril]     Listed on allergy list in wallet; unk reason per pt  . Glucophage [Metformin]     Listed on pt's med card under allergies, but pt currently on metformin  . Vasotec [Enalapril]     Listed on allergy list in pt's wallet; unk reason per pt   Follow-up Information    Follow up with Milus Banister, MD In 2 weeks.   Specialty:  Gastroenterology   Contact information:   [address] [place] [address] [phone]        The results of significant diagnostics from this hospitalization (including imaging, microbiology, ancillary and laboratory) are listed below for reference.    Significant Diagnostic Studies: Dg Chest 2 View  12/01/2015  CLINICAL DATA:  Acute onset of bilateral leg weakness. Initial encounter. EXAM: CHEST  2 VIEW COMPARISON:  Chest radiograph performed 09/12/2013 FINDINGS: The lungs are mildly hypoexpanded. There is mild elevation of the right hemidiaphragm. There is no evidence of focal opacification, pleural effusion or pneumothorax. The heart is borderline normal in size. No acute osseous abnormalities are seen. IMPRESSION: Lungs mildly hypoexpanded but grossly clear, with mild elevation of the right hemidiaphragm. Electronically Signed   By: Garald Balding M.D.   On: 12/01/2015 21:31   Ct Head Wo Contrast  12/01/2015  CLINICAL DATA:  Weakness.   Dizziness for several days. EXAM: CT HEAD WITHOUT CONTRAST TECHNIQUE: Contiguous axial images were obtained from the base of the skull through the vertex without intravenous contrast. COMPARISON:  Head CT 09/12/2013 FINDINGS: No intracranial hemorrhage, mass effect, or midline shift. The degree of atrophy and chronic small vessel ischemia is unchanged from prior exam. No hydrocephalus. The basilar cisterns are patent. No evidence of territorial infarct. No intracranial fluid collection. Atherosclerosis of skullbase vasculature. Calvarium is intact. Included  paranasal sinuses and mastoid air cells are well aerated. Improved mucosal thickening of the ethmoid air cells and maxillary sinuses. IMPRESSION: 1.  No acute intracranial abnormality. 2. Atrophy and chronic small vessel ischemia, unchanged from prior exam. Electronically Signed   By: Jeb Levering M.D.   On: 12/01/2015 21:40   Dg Foot 2 Views Left  11/30/2015  CLINICAL DATA:  Swollen, painful left great toe EXAM: LEFT FOOT - 2 VIEW COMPARISON:  None. FINDINGS: Tarsal-metatarsal alignment is normal. No fracture is seen. No definite erosion is noted. Joint spaces are relatively well preserved for age. There is faint opacity within the soft tissues adjacent to the left first metatarsal head which could represent faint calcification possibly indicative of gout. There is pes planus present. There are degenerative changes in the midfoot with degenerative calcaneal spurs also noted. IMPRESSION: 1. No acute abnormality. 2. Questionable faint calcification in the soft tissues adjacent to the left first MTP joint could indicate gout but no erosions are noted. 3. Pes planus with degenerative changes in the mid and hindfoot. Electronically Signed   By: Ivar Drape M.D.   On: 11/30/2015 15:12    Microbiology: No results found for this or any previous visit (from the past 240 hour(s)).   Labs: Basic Metabolic Panel:  Recent Labs Lab 11/30/15 1448  12/01/15 2056 12/01/15 2102 12/02/15 0512 12/03/15 0510  NA 142 140  --  142 139  K 4.1 3.3*  --  3.5 3.7  CL 103 103  --  109 109  CO2 19* 23  --  22 22  GLUCOSE 281* 237*  --  203* 96  BUN 39* 46*  --  42* 26*  CREATININE 2.08* 2.23*  --  1.82* 1.03  CALCIUM 6.5* 6.2*  --  6.3* 6.2*  MG  --   --  0.9* 1.3*  --    Liver Function Tests:  Recent Labs Lab 11/30/15 1448 12/01/15 2056 12/02/15 0512  AST 116* 112* 106*  ALT 44 43 41  ALKPHOS 252* 208* 214*  BILITOT 0.8 0.7 0.7  PROT 6.3 6.1* 5.9*  ALBUMIN 3.2* 2.7* 2.7*   No results for input(s): LIPASE, AMYLASE in the last 168 hours. No results for input(s): AMMONIA in the last 168 hours. CBC:  Recent Labs Lab 11/30/15 1457 12/01/15 2056 12/02/15 1020 12/03/15 0510 12/04/15 0746  WBC 12.3* 10.1 11.2* 12.1* 10.4  NEUTROABS  --  8.1*  --   --   --   HGB 9.1* 8.0* 8.5* 8.7* 9.4*  HCT 26.4* 24.1* 25.4* 25.7* 27.8*  MCV 87.1 88.3 87.9 86.2 84.8  PLT  --  198 180 193 215   Cardiac Enzymes: No results for input(s): CKTOTAL, CKMB, CKMBINDEX, TROPONINI in the last 168 hours. BNP: BNP (last 3 results) No results for input(s): BNP in the last 8760 hours.  ProBNP (last 3 results) No results for input(s): PROBNP in the last 8760 hours.  CBG:  Recent Labs Lab 12/03/15 1637 12/03/15 2024 12/03/15 2340 12/04/15 0405 12/04/15 0757  GLUCAP 310* 109* 84 145* 117*       Signed:  Kelvin Cellar MD.  [facility] 12/04/2015, 10:51 AM

## 2015-12-05 ENCOUNTER — Telehealth: Payer: Self-pay | Admitting: Gastroenterology

## 2015-12-05 LAB — HEMOGLOBIN A1C
HEMOGLOBIN A1C: 7.7 % — AB (ref 4.8–5.6)
Mean Plasma Glucose: 174 mg/dL

## 2015-12-05 LAB — FOLATE RBC
Folate, Hemolysate: 311.5 ng/mL
Folate, RBC: 1293 ng/mL (ref >498)
Hematocrit: 24.1 % — ABNORMAL LOW (ref 37.5–51.0)

## 2015-12-05 NOTE — Telephone Encounter (Signed)
Left message on machine to call back  

## 2015-12-07 DIAGNOSIS — C799 Secondary malignant neoplasm of unspecified site: Secondary | ICD-10-CM

## 2015-12-07 DIAGNOSIS — R6520 Severe sepsis without septic shock: Secondary | ICD-10-CM

## 2015-12-07 DIAGNOSIS — C259 Malignant neoplasm of pancreas, unspecified: Secondary | ICD-10-CM

## 2015-12-07 DIAGNOSIS — A419 Sepsis, unspecified organism: Secondary | ICD-10-CM

## 2015-12-07 HISTORY — DX: Sepsis, unspecified organism: A41.9

## 2015-12-07 HISTORY — DX: Malignant neoplasm of pancreas, unspecified: C25.9

## 2015-12-07 HISTORY — DX: Malignant neoplasm of pancreas, unspecified: C79.9

## 2015-12-07 HISTORY — DX: Severe sepsis without septic shock: R65.20

## 2015-12-07 NOTE — Telephone Encounter (Signed)
See result note.  

## 2015-12-08 ENCOUNTER — Other Ambulatory Visit: Payer: Self-pay

## 2015-12-08 DIAGNOSIS — K922 Gastrointestinal hemorrhage, unspecified: Secondary | ICD-10-CM

## 2015-12-11 ENCOUNTER — Observation Stay (HOSPITAL_BASED_OUTPATIENT_CLINIC_OR_DEPARTMENT_OTHER): Payer: Medicare Other

## 2015-12-11 ENCOUNTER — Observation Stay (HOSPITAL_COMMUNITY): Payer: Medicare Other

## 2015-12-11 ENCOUNTER — Emergency Department (HOSPITAL_COMMUNITY): Payer: Medicare Other

## 2015-12-11 ENCOUNTER — Encounter (HOSPITAL_COMMUNITY): Payer: Self-pay | Admitting: *Deleted

## 2015-12-11 ENCOUNTER — Inpatient Hospital Stay (HOSPITAL_COMMUNITY)
Admission: EM | Admit: 2015-12-11 | Discharge: 2015-12-18 | DRG: 683 | Disposition: A | Discharge status: Skilled Nursing Facility | Payer: Medicare Other | Attending: Internal Medicine | Admitting: Internal Medicine

## 2015-12-11 DIAGNOSIS — R600 Localized edema: Secondary | ICD-10-CM | POA: Diagnosis present

## 2015-12-11 DIAGNOSIS — R609 Edema, unspecified: Secondary | ICD-10-CM

## 2015-12-11 DIAGNOSIS — E119 Type 2 diabetes mellitus without complications: Secondary | ICD-10-CM

## 2015-12-11 DIAGNOSIS — E860 Dehydration: Secondary | ICD-10-CM | POA: Diagnosis present

## 2015-12-11 DIAGNOSIS — R591 Generalized enlarged lymph nodes: Secondary | ICD-10-CM | POA: Diagnosis present

## 2015-12-11 DIAGNOSIS — Z79899 Other long term (current) drug therapy: Secondary | ICD-10-CM

## 2015-12-11 DIAGNOSIS — R531 Weakness: Secondary | ICD-10-CM | POA: Diagnosis not present

## 2015-12-11 DIAGNOSIS — Z7984 Long term (current) use of oral hypoglycemic drugs: Secondary | ICD-10-CM

## 2015-12-11 DIAGNOSIS — C259 Malignant neoplasm of pancreas, unspecified: Secondary | ICD-10-CM | POA: Insufficient documentation

## 2015-12-11 DIAGNOSIS — E785 Hyperlipidemia, unspecified: Secondary | ICD-10-CM | POA: Diagnosis present

## 2015-12-11 DIAGNOSIS — R0600 Dyspnea, unspecified: Secondary | ICD-10-CM

## 2015-12-11 DIAGNOSIS — D509 Iron deficiency anemia, unspecified: Secondary | ICD-10-CM | POA: Diagnosis present

## 2015-12-11 DIAGNOSIS — D630 Anemia in neoplastic disease: Secondary | ICD-10-CM | POA: Diagnosis present

## 2015-12-11 DIAGNOSIS — A0472 Enterocolitis due to Clostridium difficile, not specified as recurrent: Secondary | ICD-10-CM | POA: Diagnosis present

## 2015-12-11 DIAGNOSIS — M109 Gout, unspecified: Secondary | ICD-10-CM | POA: Diagnosis present

## 2015-12-11 DIAGNOSIS — Z66 Do not resuscitate: Secondary | ICD-10-CM | POA: Diagnosis not present

## 2015-12-11 DIAGNOSIS — C787 Secondary malignant neoplasm of liver and intrahepatic bile duct: Secondary | ICD-10-CM | POA: Diagnosis present

## 2015-12-11 DIAGNOSIS — R0602 Shortness of breath: Secondary | ICD-10-CM

## 2015-12-11 DIAGNOSIS — R945 Abnormal results of liver function studies: Secondary | ICD-10-CM

## 2015-12-11 DIAGNOSIS — Z6828 Body mass index (BMI) 28.0-28.9, adult: Secondary | ICD-10-CM

## 2015-12-11 DIAGNOSIS — Z888 Allergy status to other drugs, medicaments and biological substances status: Secondary | ICD-10-CM

## 2015-12-11 DIAGNOSIS — E8770 Fluid overload, unspecified: Secondary | ICD-10-CM | POA: Diagnosis present

## 2015-12-11 DIAGNOSIS — N179 Acute kidney failure, unspecified: Secondary | ICD-10-CM | POA: Diagnosis present

## 2015-12-11 DIAGNOSIS — E669 Obesity, unspecified: Secondary | ICD-10-CM | POA: Diagnosis present

## 2015-12-11 DIAGNOSIS — R160 Hepatomegaly, not elsewhere classified: Secondary | ICD-10-CM

## 2015-12-11 DIAGNOSIS — R627 Adult failure to thrive: Secondary | ICD-10-CM | POA: Diagnosis present

## 2015-12-11 DIAGNOSIS — A047 Enterocolitis due to Clostridium difficile: Secondary | ICD-10-CM | POA: Diagnosis present

## 2015-12-11 DIAGNOSIS — I10 Essential (primary) hypertension: Secondary | ICD-10-CM | POA: Diagnosis present

## 2015-12-11 DIAGNOSIS — E440 Moderate protein-calorie malnutrition: Secondary | ICD-10-CM | POA: Diagnosis present

## 2015-12-11 DIAGNOSIS — J189 Pneumonia, unspecified organism: Secondary | ICD-10-CM

## 2015-12-11 DIAGNOSIS — R197 Diarrhea, unspecified: Secondary | ICD-10-CM | POA: Diagnosis present

## 2015-12-11 DIAGNOSIS — R7989 Other specified abnormal findings of blood chemistry: Secondary | ICD-10-CM

## 2015-12-11 DIAGNOSIS — Z87891 Personal history of nicotine dependence: Secondary | ICD-10-CM

## 2015-12-11 LAB — URINALYSIS, ROUTINE W REFLEX MICROSCOPIC
BILIRUBIN URINE: NEGATIVE
Glucose, UA: NEGATIVE mg/dL
KETONES UR: NEGATIVE mg/dL
Leukocytes, UA: NEGATIVE
NITRITE: NEGATIVE
Protein, ur: NEGATIVE mg/dL
Specific Gravity, Urine: 1.009 (ref 1.005–1.030)
pH: 5 (ref 5.0–8.0)

## 2015-12-11 LAB — TYPE AND SCREEN
ABO/RH(D): O POS
ANTIBODY SCREEN: NEGATIVE

## 2015-12-11 LAB — CBC
HCT: 25.9 % — ABNORMAL LOW (ref 39.0–52.0)
Hemoglobin: 8.7 g/dL — ABNORMAL LOW (ref 13.0–17.0)
MCH: 29.1 pg (ref 26.0–34.0)
MCHC: 33.6 g/dL (ref 30.0–36.0)
MCV: 86.6 fL (ref 78.0–100.0)
PLATELETS: 191 10*3/uL (ref 150–400)
RBC: 2.99 MIL/uL — ABNORMAL LOW (ref 4.22–5.81)
RDW: 16 % — AB (ref 11.5–15.5)
WBC: 12.5 10*3/uL — ABNORMAL HIGH (ref 4.0–10.5)

## 2015-12-11 LAB — HEPATIC FUNCTION PANEL
ALT: 47 U/L (ref 17–63)
AST: 121 U/L — ABNORMAL HIGH (ref 15–41)
Albumin: 2.5 g/dL — ABNORMAL LOW (ref 3.5–5.0)
Alkaline Phosphatase: 285 U/L — ABNORMAL HIGH (ref 38–126)
BILIRUBIN DIRECT: 0.5 mg/dL (ref 0.1–0.5)
BILIRUBIN INDIRECT: 1 mg/dL — AB (ref 0.3–0.9)
TOTAL PROTEIN: 5.8 g/dL — AB (ref 6.5–8.1)
Total Bilirubin: 1.5 mg/dL — ABNORMAL HIGH (ref 0.3–1.2)

## 2015-12-11 LAB — BASIC METABOLIC PANEL
ANION GAP: 12 (ref 5–15)
BUN: 30 mg/dL — AB (ref 6–20)
CALCIUM: 6.1 mg/dL — AB (ref 8.9–10.3)
CO2: 21 mmol/L — AB (ref 22–32)
CREATININE: 1.82 mg/dL — AB (ref 0.61–1.24)
Chloride: 107 mmol/L (ref 101–111)
GFR calc Af Amer: 38 mL/min — ABNORMAL LOW (ref >60)
GFR, EST NON AFRICAN AMERICAN: 32 mL/min — AB (ref >60)
GLUCOSE: 154 mg/dL — AB (ref 65–99)
Potassium: 3.8 mmol/L (ref 3.5–5.1)
Sodium: 140 mmol/L (ref 135–145)

## 2015-12-11 LAB — URINE MICROSCOPIC-ADD ON

## 2015-12-11 LAB — I-STAT TROPONIN, ED: Troponin i, poc: 0.01 ng/mL (ref 0.00–0.08)

## 2015-12-11 LAB — CK: Total CK: 200 U/L (ref 49–397)

## 2015-12-11 LAB — GLUCOSE, CAPILLARY
Glucose-Capillary: 176 mg/dL — ABNORMAL HIGH (ref 65–99)
Glucose-Capillary: 168 mg/dL — ABNORMAL HIGH (ref 65–99)

## 2015-12-11 LAB — MAGNESIUM: MAGNESIUM: 0.9 mg/dL — AB (ref 1.7–2.4)

## 2015-12-11 LAB — LIPASE, BLOOD: Lipase: 29 U/L (ref 11–51)

## 2015-12-11 LAB — CBG MONITORING, ED: GLUCOSE-CAPILLARY: 146 mg/dL — AB (ref 65–99)

## 2015-12-11 MED ORDER — INSULIN ASPART 100 UNIT/ML ~~LOC~~ SOLN
0.0000 [IU] | Freq: Three times a day (TID) | SUBCUTANEOUS | Status: DC
  Administered 2015-12-11: 2 [IU] via SUBCUTANEOUS
  Administered 2015-12-12: 3 [IU] via SUBCUTANEOUS
  Administered 2015-12-12: 1 [IU] via SUBCUTANEOUS
  Administered 2015-12-12: 2 [IU] via SUBCUTANEOUS
  Administered 2015-12-13: 3 [IU] via SUBCUTANEOUS
  Administered 2015-12-13 (×2): 1 [IU] via SUBCUTANEOUS
  Administered 2015-12-14: 2 [IU] via SUBCUTANEOUS
  Administered 2015-12-14: 3 [IU] via SUBCUTANEOUS
  Administered 2015-12-14 – 2015-12-15 (×2): 2 [IU] via SUBCUTANEOUS
  Administered 2015-12-15: 1 [IU] via SUBCUTANEOUS
  Administered 2015-12-15 – 2015-12-16 (×2): 3 [IU] via SUBCUTANEOUS
  Administered 2015-12-16 (×2): 2 [IU] via SUBCUTANEOUS
  Administered 2015-12-17: 3 [IU] via SUBCUTANEOUS
  Administered 2015-12-17 (×2): 5 [IU] via SUBCUTANEOUS
  Administered 2015-12-18: 2 [IU] via SUBCUTANEOUS
  Administered 2015-12-18: 3 [IU] via SUBCUTANEOUS

## 2015-12-11 MED ORDER — MAGNESIUM SULFATE 4 GM/100ML IV SOLN
4.0000 g | Freq: Once | INTRAVENOUS | Status: AC
  Administered 2015-12-11: 4 g via INTRAVENOUS
  Filled 2015-12-11: qty 100

## 2015-12-11 MED ORDER — LATANOPROST 0.005 % OP SOLN
1.0000 [drp] | Freq: Every day | OPHTHALMIC | Status: DC
  Administered 2015-12-11 – 2015-12-17 (×7): 1 [drp] via OPHTHALMIC
  Filled 2015-12-11: qty 2.5

## 2015-12-11 MED ORDER — SODIUM CHLORIDE 0.9 % IV SOLN
2.0000 g | Freq: Once | INTRAVENOUS | Status: AC
  Administered 2015-12-11: 2 g via INTRAVENOUS
  Filled 2015-12-11: qty 20

## 2015-12-11 MED ORDER — SODIUM CHLORIDE 0.9 % IV SOLN
INTRAVENOUS | Status: DC
  Administered 2015-12-11 – 2015-12-13 (×4): via INTRAVENOUS

## 2015-12-11 MED ORDER — SODIUM CHLORIDE 0.9% FLUSH
3.0000 mL | Freq: Two times a day (BID) | INTRAVENOUS | Status: DC
  Administered 2015-12-12 – 2015-12-17 (×11): 3 mL via INTRAVENOUS

## 2015-12-11 MED ORDER — MAGNESIUM OXIDE 400 (241.3 MG) MG PO TABS
400.0000 mg | ORAL_TABLET | Freq: Two times a day (BID) | ORAL | Status: DC
  Administered 2015-12-11 – 2015-12-18 (×14): 400 mg via ORAL
  Filled 2015-12-11 (×14): qty 1

## 2015-12-11 MED ORDER — CHLORHEXIDINE GLUCONATE 0.12 % MT SOLN
15.0000 mL | Freq: Two times a day (BID) | OROMUCOSAL | Status: DC
  Administered 2015-12-11 – 2015-12-18 (×14): 15 mL via OROMUCOSAL
  Filled 2015-12-11 (×12): qty 15

## 2015-12-11 MED ORDER — ATORVASTATIN CALCIUM 10 MG PO TABS
20.0000 mg | ORAL_TABLET | Freq: Every day | ORAL | Status: DC
  Administered 2015-12-11 – 2015-12-12 (×2): 20 mg via ORAL
  Filled 2015-12-11 (×2): qty 2

## 2015-12-11 MED ORDER — CETYLPYRIDINIUM CHLORIDE 0.05 % MT LIQD
7.0000 mL | Freq: Two times a day (BID) | OROMUCOSAL | Status: DC
  Administered 2015-12-11 – 2015-12-18 (×13): 7 mL via OROMUCOSAL

## 2015-12-11 MED ORDER — MAGNESIUM SULFATE 2 GM/50ML IV SOLN: 2.0000 g | Freq: Once | INTRAVENOUS | Status: DC

## 2015-12-11 MED ORDER — SODIUM CHLORIDE 0.9 % IV BOLUS (SEPSIS)
500.0000 mL | Freq: Once | INTRAVENOUS | Status: AC
  Administered 2015-12-11: 500 mL via INTRAVENOUS

## 2015-12-11 MED ORDER — PANTOPRAZOLE SODIUM 40 MG PO TBEC
40.0000 mg | DELAYED_RELEASE_TABLET | Freq: Every day | ORAL | Status: DC
  Administered 2015-12-11 – 2015-12-18 (×8): 40 mg via ORAL
  Filled 2015-12-11 (×8): qty 1

## 2015-12-11 MED ORDER — SODIUM CHLORIDE 0.9 % IV SOLN
Freq: Once | INTRAVENOUS | Status: AC
  Administered 2015-12-11: 11:00:00 via INTRAVENOUS

## 2015-12-11 MED ORDER — TERAZOSIN HCL 5 MG PO CAPS
5.0000 mg | ORAL_CAPSULE | Freq: Every day | ORAL | Status: DC
  Administered 2015-12-11 – 2015-12-17 (×7): 5 mg via ORAL
  Filled 2015-12-11 (×8): qty 1

## 2015-12-11 NOTE — ED Notes (Signed)
Pt reports being discharged from the hospital last week, found to have low hgb, had one unit of PRBC and had a colonoscopy done.  Pt reports since discharge, he has not felt good.  Has felt weak, dizzy when walking.  Is not able to eat or drink, his daughter reports the only thing he can tolerate is soup.  She reports every time pt eats pt starts to "gag."  She reports pt also has not been drinking water.  Pt reports urinary frequency and was requesting to be catheterized.  Pt is A&Ox 4.  Denies any cp or SOB at this time.

## 2015-12-11 NOTE — ED Notes (Signed)
Family at bedside. 

## 2015-12-11 NOTE — Progress Notes (Signed)
VASCULAR LAB PRELIMINARY  PRELIMINARY  PRELIMINARY  PRELIMINARY  Bilateral lower extremity venous duplex completed.    Preliminary report:  Bilateral:  No evidence of DVT, superficial thrombosis, or Baker's Cyst.    Janifer Adie, RVT, RDMS 12/11/2015, 4:29 PM

## 2015-12-11 NOTE — ED Notes (Signed)
Per EMS, pt from home, pt reports decreased appetite, weakness, vomiting and loose stools.  Was just discharged from the hospital a week ago.

## 2015-12-11 NOTE — ED Notes (Signed)
Bed: [id number] Expected date:  Expected time:  Means of arrival:  Comments: EMS- 82yo M, weakness

## 2015-12-11 NOTE — ED Notes (Signed)
MD at bedside. Hospitalist at bedside. 

## 2015-12-11 NOTE — ED Notes (Signed)
Pt can go to floor at 12:08.

## 2015-12-11 NOTE — H&P (Signed)
[facility] History and Physical  [name] [name] ZOX:[id number] DOB: 12-04-1928 DOA: 12/11/2015  Referring physician:  PCP: No PCP Per Patient  Specialists:   Chief Complaint: weakness, poor oral intake   HPI: [name] [name] is a 82 y.o. male with PMH of HTN, DM, Gout, Recent hospitalization for GI bleeding (5/26-5/29) presented with generalized weakness, poor oral intake for several days. Family brought him to the emeregency room due to concern for dehydration. Found to have hypomagnesemia, hypocalcemia and acute renal failure in ED. Patient denies acute abdominal pains, no diarrhea, no vomiting, but reports poor appetite and "food does not taste". He denies acute chest pains, no SOB, no fever, no focal neuro symptoms  -ED: AKI, dehydration. hospitalist is called for overnight observation, hydration   Review of Systems: The patient denies anorexia, fever, weight loss,, vision loss, decreased hearing, hoarseness, chest pain, syncope, dyspnea on exertion, peripheral edema, balance deficits, hemoptysis, abdominal pain, melena, hematochezia, severe indigestion/heartburn, hematuria, incontinence, genital sores, muscle weakness, suspicious skin lesions, transient blindness, difficulty walking, depression, unusual weight change, abnormal bleeding, enlarged lymph nodes, angioedema, and breast masses.    Past Medical History  Diagnosis Date  . Hypertension   . Diabetes mellitus without complication ([place] [place])   . Gout   . Hyperlipidemia    Past Surgical History  Procedure Laterality Date  . Esophagogastroduodenoscopy N/A 12/03/2015    Procedure: ESOPHAGOGASTRODUODENOSCOPY (EGD);  Surgeon: Milus Banister, MD;  Location: [facility];  Service: Endoscopy;  Laterality: N/A;  . Colonoscopy N/A 12/04/2015    Procedure: COLONOSCOPY;  Surgeon: Milus Banister, MD;  Location: [facility] ENDOSCOPY;  Service: Endoscopy;  Laterality: N/A;   Social History:  reports that he has quit smoking. He has never used  smokeless tobacco. He reports that he does not drink alcohol or use illicit drugs. Home;  where does patient live--home, ALF, SNF? and with whom if at home? Yes;  Can patient participate in ADLs?  Allergies  Allergen Reactions  . Ace Inhibitors     Listed on allergy list in wallet; unk reason per pt  . Capoten [Captopril]     Listed on allergy list in wallet; unk reason per pt  . Glucophage [Metformin]     Listed on pt's med card under allergies, but pt currently on metformin  . Vasotec [Enalapril]     Listed on allergy list in pt's wallet; unk reason per pt    History reviewed. No pertinent family history.  (be sure to complete)  Prior to Admission medications   Medication Sig Start Date End Date Taking? Authorizing Provider  atorvastatin (LIPITOR) 20 MG tablet Take 20 mg by mouth daily.   Yes Historical Provider, MD  colchicine 0.6 MG tablet Take 2 pills now, then 1 pill in an hour. Wait 3 days before repeating if your gout attack continues. 11/30/15  Yes Jaynee Eagles, PA-C  glipiZIDE (GLUCOTROL XL) 10 MG 24 hr tablet Take 10 mg by mouth daily with breakfast.   Yes Historical Provider, MD  latanoprost (XALATAN) 0.005 % ophthalmic solution Place 1 drop into both eyes at bedtime. 11/13/15  Yes Historical Provider, MD  losartan (COZAAR) 100 MG tablet Take 100 mg by mouth every morning.    Yes Historical Provider, MD  metFORMIN (GLUCOPHAGE) 500 MG tablet Take 1,000 mg by mouth every evening.    Yes Historical Provider, MD  pantoprazole (PROTONIX) 40 MG tablet Take 1 tablet (40 mg total) by mouth daily. 12/04/15  Yes Kelvin Cellar, MD  saxagliptin HCl (  ONGLYZA) 5 MG TABS tablet Take 5 mg by mouth daily.   Yes Historical Provider, MD  terazosin (HYTRIN) 5 MG capsule Take 5 mg by mouth at bedtime.    Yes Historical Provider, MD  VESICARE 5 MG tablet Take 5 mg by mouth daily. 11/27/15  Yes Historical Provider, MD   Physical Exam: Filed Vitals:   12/11/15 1023 12/11/15 1030  BP: 152/93 138/88   Pulse: 96 94  Temp:    Resp: 18 22     General:  Alert, no distress   Eyes: eom-i, perrla   ENT: no oral ulcers   Neck: supple, no JVD  Cardiovascular: s1,s2 rrr  Respiratory: CTA BL   Abdomen: soft, nt, nd   Skin: no rash, +ecchymosis   Musculoskeletal: pedal edema   Psychiatric: no hallucinations   Neurologic: CN 2-12 intact, motor 5/5 BL   Labs on Admission:  Basic Metabolic Panel:  Recent Labs Lab 12/11/15 0816  NA 140  K 3.8  CL 107  CO2 21*  GLUCOSE 154*  BUN 30*  CREATININE 1.82*  CALCIUM 6.1*  MG 0.9*   Liver Function Tests:  Recent Labs Lab 12/11/15 0816  AST 121*  ALT 47  ALKPHOS 285*  BILITOT 1.5*  PROT 5.8*  ALBUMIN 2.5*   No results for input(s): LIPASE, AMYLASE in the last 168 hours. No results for input(s): AMMONIA in the last 168 hours. CBC:  Recent Labs Lab 12/11/15 0816  WBC 12.5*  HGB 8.7*  HCT 25.9*  MCV 86.6  PLT 191   Cardiac Enzymes: No results for input(s): CKTOTAL, CKMB, CKMBINDEX, TROPONINI in the last 168 hours.  BNP (last 3 results) No results for input(s): BNP in the last 8760 hours.  ProBNP (last 3 results) No results for input(s): PROBNP in the last 8760 hours.  CBG:  Recent Labs Lab 12/11/15 0824  GLUCAP 146*    Radiological Exams on Admission: Dg Chest 2 View  12/11/2015  CLINICAL DATA:  82 year old male with recent increase in weakness. Cough, loss of appetite. EXAM: CHEST  2 VIEW COMPARISON:  Chest x-ray 12/01/2015. FINDINGS: Mild elevation of the right hemidiaphragm (chronic and unchanged). Lung volumes are normal. No consolidative airspace disease. No pleural effusions. No pneumothorax. No pulmonary nodule or mass noted. Pulmonary vasculature and the cardiomediastinal silhouette are within normal limits. Atherosclerosis in the thoracic aorta. IMPRESSION: 1. No radiographic evidence of acute cardiopulmonary disease. Electronically Signed   By: Vinnie Langton M.D.   On: 12/11/2015 08:39     EKG: Independently reviewed.   Assessment/Plan Active Problems:   AKI (acute kidney injury) ([place])   82 y.o. male with PMH of HTN, DM, Gout, Recent hospitalization for GI bleeding (5/26-5/29) presented with generalized weakness, poor oral intake for several days. Admitted with AKI, dehydration, hypomagnesemia, hypocalcemia   AKI, dehydration, hypomagnesemia, hypocalcemia. Poor oral intake. We start IVF, replace electrolytes, encouraged to oral intake. Check renal US, check CK. hold losartan, metformin   Mild elevated bili-1.5, alk phos-285, AST-121. abd exam: benign. Will check lipase, liver US.   DM. Will d/c metformin due to renal issues. Check ha1c; monitor on ISS Leg edema. L>R. Check [place] r/o DVT. Also obtain echo questionable chronic chf. Will not start diuretics pend echo. Lung are clear on exam, no s/s of acute HF   Recent hospitalization for GI bleeding (5/26-5/29). EGD: non bleeding angiodysplastic lesion s/p coagulation. (APM) -no s/s of acute bleeding, cont monitor, PPI  Non;  if consultant consulted, please document name and whether  formally or informally consulted  Code Status: full (must indicate code status--if unknown or must be presumed, indicate so) Family Communication: d/w patient, his daughter  (indicate person spoken with, if applicable, with phone number if by telephone) Disposition Plan: 24-48 hrs  (indicate anticipated LOS)  Time spent: >45 minutes   Kinnie Feil [facility] Pager [phone]  If 7PM-7AM, please contact night-coverage [email or web address] Password [email or web address] 12/11/2015, 11:47 AM

## 2015-12-11 NOTE — ED Provider Notes (Signed)
CSN: [id number]     Arrival date & time 12/11/15  [id number] History   First MD Initiated Contact with Patient 12/11/15 [phone]     Chief Complaint  Patient presents with  . Weakness     (Consider location/radiation/quality/duration/timing/severity/associated sxs/prior Treatment) HPI 82 year old male who presents with generalized weakness. He has a history of hypertension, diabetes, hyperlipidemia and gout. 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 He had a KI, and had symptomatic anemia requiring transfusion. He was discharged from the hospital one week ago. States that initially felt okay, but subsequently has been feeling progressively weak. States that he has no appetite, not wanting to eat or drink. States that he feels lightheaded with activity, and very weak all over with any minimal activity. He has not had fever, chills, chest pain or shortness of breath, syncope, chest pain, cough, severe back pain, abdominal pain. He has no further melena or hematochezia. Denies any urinary complaints but states that he does need to be straight cathed for his urine. Feels nauseous with eating and has had some loose stools.   Past Medical History  Diagnosis Date  . Hypertension   . Diabetes mellitus without complication ([place])   . Gout   . Hyperlipidemia    Past Surgical History  Procedure Laterality Date  . Esophagogastroduodenoscopy N/A 12/03/2015    Procedure: ESOPHAGOGASTRODUODENOSCOPY (EGD);  Surgeon: Milus Banister, MD;  Location: [facility];  Service: Endoscopy;  Laterality: N/A;  . Colonoscopy N/A 12/04/2015    Procedure: COLONOSCOPY;  Surgeon: Milus Banister, MD;  Location: [facility];  Service: Endoscopy;  Laterality: N/A;   No family history on file. Social History  Substance Use Topics  . Smoking status: Former Research scientist (life sciences)  . Smokeless tobacco: None  . Alcohol Use: No    Review of Systems 10/14  systems reviewed and are negative other than those stated in the HPI    Allergies  Ace inhibitors; Capoten; Glucophage; and Vasotec  Home Medications   Prior to Admission medications   Medication Sig Start Date End Date Taking? Authorizing Provider  atorvastatin (LIPITOR) 20 MG tablet Take 20 mg by mouth daily.   Yes Historical Provider, MD  colchicine 0.6 MG tablet Take 2 pills now, then 1 pill in an hour. Wait 3 days before repeating if your gout attack continues. 11/30/15  Yes Jaynee Eagles, PA-C  glipiZIDE (GLUCOTROL XL) 10 MG 24 hr tablet Take 10 mg by mouth daily with breakfast.   Yes Historical Provider, MD  latanoprost (XALATAN) 0.005 % ophthalmic solution Place 1 drop into both eyes at bedtime. 11/13/15  Yes Historical Provider, MD  losartan (COZAAR) 100 MG tablet Take 100 mg by mouth every morning.    Yes Historical Provider, MD  metFORMIN (GLUCOPHAGE) 500 MG tablet Take 1,000 mg by mouth every evening.    Yes Historical Provider, MD  pantoprazole (PROTONIX) 40 MG tablet Take 1 tablet (40 mg total) by mouth daily. 12/04/15  Yes Kelvin Cellar, MD  saxagliptin HCl (ONGLYZA) 5 MG TABS tablet Take 5 mg by mouth daily.   Yes Historical Provider, MD  terazosin (HYTRIN) 5 MG capsule Take 5 mg by mouth at bedtime.    Yes Historical Provider, MD  VESICARE 5 MG tablet Take 5 mg by mouth daily. 11/27/15  Yes Historical Provider, MD   BP 138/88 mmHg  Pulse 94  Temp(Src) 98.6 F (37 C) (Oral)  Resp 22  SpO2  98% Physical Exam  Physical Exam  Nursing note and vitals reviewed. Constitutional: Elderly man, non-toxic, and in no acute distress Head: Normocephalic and atraumatic.  Mouth/Throat: Oropharynx is clear and mucous membranes are dry.  Neck: Normal range of motion. Neck supple.  Cardiovascular: Normal rate and regular rhythm.  +1 edema bilaterally Pulmonary/Chest: Effort normal and breath sounds normal.  Abdominal: Soft. There is no tenderness. There is no rebound and no guarding.   Musculoskeletal: Normal range of motion.  Neurological: Alert, no facial droop, fluent speech, moves all extremities symmetrically Skin: Skin is warm and dry.  Psychiatric: Cooperative   ED Course  Procedures (including critical care time) Labs Review Labs Reviewed  BASIC METABOLIC PANEL - Abnormal; Notable for the following:    CO2 21 (*)    Glucose, Bld 154 (*)    BUN 30 (*)    Creatinine, Ser 1.82 (*)    Calcium 6.1 (*)    GFR calc non Af Amer 32 (*)    GFR calc Af Amer 38 (*)    All other components within normal limits  CBC - Abnormal; Notable for the following:    WBC 12.5 (*)    RBC 2.99 (*)    Hemoglobin 8.7 (*)    HCT 25.9 (*)    RDW 16.0 (*)    All other components within normal limits  URINALYSIS, ROUTINE W REFLEX MICROSCOPIC (NOT AT [facility]) - Abnormal; Notable for the following:    APPearance CLOUDY (*)    Hgb urine dipstick SMALL (*)    All other components within normal limits  HEPATIC FUNCTION PANEL - Abnormal; Notable for the following:    Total Protein 5.8 (*)    Albumin 2.5 (*)    AST 121 (*)    Alkaline Phosphatase 285 (*)    Total Bilirubin 1.5 (*)    Indirect Bilirubin 1.0 (*)    All other components within normal limits  MAGNESIUM - Abnormal; Notable for the following:    Magnesium 0.9 (*)    All other components within normal limits  URINE MICROSCOPIC-ADD ON - Abnormal; Notable for the following:    Squamous Epithelial / LPF 0-5 (*)    Bacteria, UA RARE (*)    All other components within normal limits  CBG MONITORING, ED - Abnormal; Notable for the following:    Glucose-Capillary 146 (*)    All other components within normal limits  I-STAT TROPOININ, ED  TYPE AND SCREEN    Imaging Review Dg Chest 2 View  12/11/2015  CLINICAL DATA:  82 year old male with recent increase in weakness. Cough, loss of appetite. EXAM: CHEST  2 VIEW COMPARISON:  Chest x-ray 12/01/2015. FINDINGS: Mild elevation of the right hemidiaphragm (chronic and unchanged).  Lung volumes are normal. No consolidative airspace disease. No pleural effusions. No pneumothorax. No pulmonary nodule or mass noted. Pulmonary vasculature and the cardiomediastinal silhouette are within normal limits. Atherosclerosis in the thoracic aorta. IMPRESSION: 1. No radiographic evidence of acute cardiopulmonary disease. Electronically Signed   By: Vinnie Langton M.D.   On: 12/11/2015 08:39   I have personally reviewed and evaluated these images and lab results as part of my medical decision-making.   EKG Interpretation   Date/Time:  Monday December 11 2015 08:09:31 EDT Ventricular Rate:  104 PR Interval:  151 QRS Duration: 125 QT Interval:  399 QTC Calculation: 525 R Axis:   74 Text Interpretation:  Sinus arrhythmia Ventricular premature complex Right  bundle branch block Wandering baseline, similar to prior  EKG 12/01/2015  Confirmed by Conna Terada MD, Hinton Dyer [phone]) on 12/11/2015 8:19:13 AM      MDM   Final diagnoses:  Dehydration  AKI (acute kidney injury) ([place])  Hypomagnesemia   82 year old male with history of hyperlipidemia, hypertension, diabetes and recent upper GI bleed who presents with generalized weakness and decreased appetite. He is nontoxic in no acute distress. Appears dry on exam, is mildly tachycardic. Afebrile and without any evidence of sepsis or severe systemic illness. Workup revealing no major evidence of infection. His urinalysis is unremarkable and chest x-ray is clear. He has no reports of recurrent GI bleeding, and has a stable anemia of 8.7. With dehydration since discharge and a KI with a creatinine of 1.8 and hypomagnesemia of 0.9 which is recurrent. Given 4 g of magnesium sulfate, and small bolus of IV fluids. Unclear etiology of this generalized weakness and decreased appetite although on colonoscopy one week ago he did have polyp that was c/f neoplastic appearance but pending biopsy. Discussed with [facility] hospitalist who will admit to observation for rehydration  and electrolyte repletion.   Forde Dandy, MD 12/11/15 [phone]

## 2015-12-12 ENCOUNTER — Observation Stay (HOSPITAL_COMMUNITY): Payer: Medicare Other

## 2015-12-12 ENCOUNTER — Other Ambulatory Visit (HOSPITAL_COMMUNITY): Payer: Medicare Other

## 2015-12-12 DIAGNOSIS — E440 Moderate protein-calorie malnutrition: Secondary | ICD-10-CM | POA: Diagnosis present

## 2015-12-12 DIAGNOSIS — R7989 Other specified abnormal findings of blood chemistry: Secondary | ICD-10-CM

## 2015-12-12 DIAGNOSIS — R160 Hepatomegaly, not elsewhere classified: Secondary | ICD-10-CM

## 2015-12-12 DIAGNOSIS — E860 Dehydration: Secondary | ICD-10-CM | POA: Diagnosis not present

## 2015-12-12 DIAGNOSIS — N179 Acute kidney failure, unspecified: Secondary | ICD-10-CM | POA: Diagnosis not present

## 2015-12-12 LAB — COMPREHENSIVE METABOLIC PANEL
ALBUMIN: 2.3 g/dL — AB (ref 3.5–5.0)
ALK PHOS: 253 U/L — AB (ref 38–126)
ALT: 45 U/L (ref 17–63)
ANION GAP: 10 (ref 5–15)
AST: 120 U/L — AB (ref 15–41)
BILIRUBIN TOTAL: 1.6 mg/dL — AB (ref 0.3–1.2)
BUN: 23 mg/dL — AB (ref 6–20)
CALCIUM: 6.1 mg/dL — AB (ref 8.9–10.3)
CO2: 21 mmol/L — ABNORMAL LOW (ref 22–32)
Chloride: 109 mmol/L (ref 101–111)
Creatinine, Ser: 1.37 mg/dL — ABNORMAL HIGH (ref 0.61–1.24)
GFR calc Af Amer: 53 mL/min — ABNORMAL LOW (ref >60)
GFR, EST NON AFRICAN AMERICAN: 46 mL/min — AB (ref >60)
GLUCOSE: 141 mg/dL — AB (ref 65–99)
Potassium: 3.9 mmol/L (ref 3.5–5.1)
Sodium: 140 mmol/L (ref 135–145)
TOTAL PROTEIN: 5.4 g/dL — AB (ref 6.5–8.1)

## 2015-12-12 LAB — CBC
HCT: 23.8 % — ABNORMAL LOW (ref 39.0–52.0)
Hemoglobin: 8 g/dL — ABNORMAL LOW (ref 13.0–17.0)
MCH: 29.3 pg (ref 26.0–34.0)
MCHC: 33.6 g/dL (ref 30.0–36.0)
MCV: 87.2 fL (ref 78.0–100.0)
PLATELETS: 185 10*3/uL (ref 150–400)
RBC: 2.73 MIL/uL — ABNORMAL LOW (ref 4.22–5.81)
RDW: 16.4 % — ABNORMAL HIGH (ref 11.5–15.5)
WBC: 12.2 10*3/uL — ABNORMAL HIGH (ref 4.0–10.5)

## 2015-12-12 LAB — VITAMIN D 25 HYDROXY (VIT D DEFICIENCY, FRACTURES): Vit D, 25-Hydroxy: 10.4 ng/mL — ABNORMAL LOW (ref 30.0–100.0)

## 2015-12-12 LAB — MAGNESIUM: Magnesium: 1.5 mg/dL — ABNORMAL LOW (ref 1.7–2.4)

## 2015-12-12 LAB — HEMOGLOBIN A1C
Hgb A1c MFr Bld: 7.1 % — ABNORMAL HIGH (ref 4.8–5.6)
Mean Plasma Glucose: 157 mg/dL

## 2015-12-12 LAB — GLUCOSE, CAPILLARY
GLUCOSE-CAPILLARY: 142 mg/dL — AB (ref 65–99)
Glucose-Capillary: 184 mg/dL — ABNORMAL HIGH (ref 65–99)
Glucose-Capillary: 205 mg/dL — ABNORMAL HIGH (ref 65–99)
Glucose-Capillary: 184 mg/dL — ABNORMAL HIGH (ref 65–99)

## 2015-12-12 MED ORDER — HYDROCOD POLST-CPM POLST ER 10-8 MG/5ML PO SUER
5.0000 mL | Freq: Once | ORAL | Status: AC
  Administered 2015-12-12: 5 mL via ORAL
  Filled 2015-12-12: qty 5

## 2015-12-12 MED ORDER — DIATRIZOATE MEGLUMINE & SODIUM 66-10 % PO SOLN
15.0000 mL | ORAL | Status: DC | PRN
  Administered 2015-12-12: 15 mL via ORAL
  Filled 2015-12-12 (×2): qty 30

## 2015-12-12 MED ORDER — MAGNESIUM SULFATE 2 GM/50ML IV SOLN
2.0000 g | Freq: Once | INTRAVENOUS | Status: AC
  Administered 2015-12-12: 2 g via INTRAVENOUS
  Filled 2015-12-12: qty 50

## 2015-12-12 MED ORDER — ENSURE ENLIVE PO LIQD
237.0000 mL | Freq: Two times a day (BID) | ORAL | Status: DC
  Administered 2015-12-12 – 2015-12-18 (×10): 237 mL via ORAL

## 2015-12-12 MED ORDER — CALCIUM CARBONATE-VITAMIN D 500-200 MG-UNIT PO TABS
1.0000 | ORAL_TABLET | Freq: Every day | ORAL | Status: DC
  Administered 2015-12-13 – 2015-12-18 (×6): 1 via ORAL
  Filled 2015-12-12 (×6): qty 1

## 2015-12-12 MED ORDER — CARVEDILOL 3.125 MG PO TABS
3.1250 mg | ORAL_TABLET | Freq: Two times a day (BID) | ORAL | Status: DC
  Administered 2015-12-12 – 2015-12-13 (×2): 3.125 mg via ORAL
  Filled 2015-12-12 (×2): qty 1

## 2015-12-12 MED ORDER — SODIUM CHLORIDE 0.9 % IV SOLN
2.0000 g | Freq: Once | INTRAVENOUS | Status: AC
  Administered 2015-12-12: 2 g via INTRAVENOUS
  Filled 2015-12-12: qty 20

## 2015-12-12 NOTE — Progress Notes (Addendum)
CRITICAL VALUE ALERT  Critical value received:  Calcium 6.1  Date of notification: 12/12/2015  Time of notification:  [id number]  Critical value read back:Yes.    Nurse who received alert:  Janell Quiet   MD notified (1st page):  Lamar Blinks NP  Time of first page:  [phone]  MD notified (2nd page):  Time of second page:  Responding MD:  Lamar Blinks NP  Time MD responded:  [phone]

## 2015-12-12 NOTE — Progress Notes (Addendum)
PROGRESS NOTE  [name] [name] HGD:[id number] DOB: [birth year] DOA: 12/11/2015 PCP: No PCP Per Patient  HPI/Recap of past 24 hours:  poor appetite, weight loss, progressive weight loss in the last few months, denies pain,  Daughter at bedside  Assessment/Plan: Active Problems:   AKI (acute kidney injury) ([place])   Dehydration   AKI, dehydration, hypomagnesemia, hypocalcemia. Poor oral intake.   [place] no infection, renal US nonobstructing stone on the left side, 4cm bladder calculus, bph,CK wnl,   hold losartan and metformin, continue IVF, replace electrolytes, encouraged to oral intake. Nutrition consult.   Mild elevated lft/liver masses:  bili-1.5, alk phos-285, AST-121. abd exam: benign. No abdominal pain,. Lipase wnl,   liver US : 1. Multiple indeterminate hypoechoic liver masses scattered throughout the liver measuring up to 6.4 cm in the right liver lobe, worrisome for liver metastases. Further evaluation with CT abdomen/pelvis with IV and oral contrast is recommended. 2. Trace perihepatic ascites. 3. Contracted gallbladder with no gallstones.  due to elevated cr, will get ct ab pel without contrast for now, will check afp/cea/hepatitis/hiv/psa. Patient denies h/o alcohol use, report remote h/o cigarette smoking for about 10 years. Patient and his daughter informed about liver US findings.  PM addendeum: CT concerning for metastatic cancer, i talked to oncology Dr Markus Daft, he recommended to proceed with US guided liver biopsy, order placed, patient and family updated over the phone ( daughter [name] Hua)   noninsulin dependent DM2. Hold oral meds, consider d/c metformin if renal function does not improve. a1c 7.1; monitor on ISS  Left leg edema. L>R. [place] no DVT.  echo pending, currently overall dehydrated,   anemia: iron deficiency: Recent hospitalization in 11/2015 for GI bleeding (5/26-5/29). EGD: non bleeding angiodysplastic lesion s/p coagulation. (APM) -no s/s of acute bleeding,  cont monitor, PPI  HTN/HLD; hold home meds cozaar and statin due to elevated cr and lft. Start low dose coreg and titrate for bp control.   FTT, progressive weakness, poor appetite, weight loss, nutrition consult, PT, may need home health at discharge  Code Status: full  Family Communication: patient and daughter  Disposition Plan: pending   Consultants:  none  Procedures:  none  Antibiotics:  none   Objective: BP 147/96 mmHg  Pulse 93  Temp(Src) 99.1 F (37.3 C) (Oral)  Resp 18  Ht '5\' 10"'$  (1.778 m)  Wt 91.1 kg (200 lb 13.4 oz)  BMI 28.82 kg/m2  SpO2 100%  Intake/Output Summary (Last 24 hours) at 12/12/15 9622 Last data filed at 12/12/15 0700  Gross per 24 hour  Intake   2845 ml  Output    850 ml  Net   1995 ml   Filed Weights   12/11/15 1252  Weight: 91.1 kg (200 lb 13.4 oz)    Exam:   General:  Frail, NAD  Cardiovascular: RRR  Respiratory: CTABL  Abdomen: Soft/ND/NT, positive BS  Musculoskeletal: left leg edema  Neuro:aaox3, flat affect, mild psychomotor retardation ( daughter reported patient's wife died two years ago and a grandchild died last year, patient has not been motivated to do much for the last two years), patient denies depression.   Data Reviewed: Basic Metabolic Panel:  Recent Labs Lab 12/11/15 0816 12/12/15 0449  NA 140 140  K 3.8 3.9  CL 107 109  CO2 21* 21*  GLUCOSE 154* 141*  BUN 30* 23*  CREATININE 1.82* 1.37*  CALCIUM 6.1* 6.1*  MG 0.9* 1.5*   Liver Function Tests:  Recent Labs Lab 12/11/15 0816 12/12/15  0449  AST 121* 120*  ALT 47 45  ALKPHOS 285* 253*  BILITOT 1.5* 1.6*  PROT 5.8* 5.4*  ALBUMIN 2.5* 2.3*    Recent Labs Lab 12/11/15 1352  LIPASE 29   No results for input(s): AMMONIA in the last 168 hours. CBC:  Recent Labs Lab 12/11/15 0816 12/12/15 0449  WBC 12.5* 12.2*  HGB 8.7* 8.0*  HCT 25.9* 23.8*  MCV 86.6 87.2  PLT 191 185   Cardiac Enzymes:    Recent Labs Lab  12/11/15 1352  CKTOTAL 200   BNP (last 3 results) No results for input(s): BNP in the last 8760 hours.  ProBNP (last 3 results) No results for input(s): PROBNP in the last 8760 hours.  CBG:  Recent Labs Lab 12/11/15 0824 12/11/15 1640 12/11/15 2134 12/12/15 0747  GLUCAP 146* 176* 168* 142*    No results found for this or any previous visit (from the past 240 hour(s)).   Studies: US Renal  12/12/2015  CLINICAL DATA:  Increased BUN and creatinine, edema EXAM: RENAL / URINARY TRACT ULTRASOUND COMPLETE COMPARISON:  None. FINDINGS: Right Kidney: Length: 11 cm. Echogenicity within normal limits. No mass or hydronephrosis visualized. Left Kidney: Length: 11.4 cm. Normal echogenicity with no hydronephrosis. 2 x 2 x 2.2 cm hypoechoic mass interpolar region left kidney. 15 mm echogenic focus with shadowing in the lower pole suggesting stone. Bladder: 4.2 cm echogenic focus within the bladder causing posterior acoustic shadowing possibly representing a large calculus. Prostate is enlarged at 65 x 64 x 67 mm. IMPRESSION: 1. Indeterminate left renal mass versus cyst. If the patient cannot undergo contrast-enhanced CT scan consider renal protocol MRI. 2. Nonobstructing 15 mm left renal stone 3. 4 cm bladder calculus.  Enlargement of the prostate. Electronically Signed   By: Skipper Cliche M.D.   On: 12/12/2015 08:57   US Abdomen Limited Ruq  12/12/2015  CLINICAL DATA:  82 year old male inpatient with elevated liver function tests. EXAM: US ABDOMEN LIMITED - RIGHT UPPER QUADRANT COMPARISON:  None. FINDINGS: Gallbladder: Contracted gallbladder, limiting gallbladder evaluation. No definite gallbladder wall thickening accounting for the contracted state. No gallstones, pericholecystic fluid or sonographic Murphy sign. Common bile duct: Diameter: 2 mm Liver: There are multiple (greater than 5) hypoechoic liver masses scattered throughout the liver measuring up to 6.3 x 5.7 x 6.4 cm in the right liver lobe.  The background liver parenchymal echotexture and echogenicity are normal. Trace perihepatic ascites. IMPRESSION: 1. Multiple indeterminate hypoechoic liver masses scattered throughout the liver measuring up to 6.4 cm in the right liver lobe, worrisome for liver metastases. Further evaluation with CT abdomen/pelvis with IV and oral contrast is recommended. 2. Trace perihepatic ascites. 3. Contracted gallbladder with no gallstones. Electronically Signed   By: Ilona Sorrel M.D.   On: 12/12/2015 09:00    Scheduled Meds: . antiseptic oral rinse  7 mL Mouth Rinse q12n4p  . atorvastatin  20 mg Oral Daily  . chlorhexidine  15 mL Mouth Rinse BID  . insulin aspart  0-9 Units Subcutaneous TID WC  . latanoprost  1 drop Both Eyes QHS  . magnesium oxide  400 mg Oral BID  . magnesium sulfate 1 - 4 g bolus IVPB  2 g Intravenous Once  . pantoprazole  40 mg Oral Daily  . sodium chloride flush  3 mL Intravenous Q12H  . terazosin  5 mg Oral QHS    Continuous Infusions: . sodium chloride 100 mL/hr at 12/12/15 0626     Time spent: 47mns  Jaymes Hang MD, PhD  [facility] Pager [phone]. If 7PM-7AM, please contact night-coverage at [email or web address], password [facility] 12/12/2015, 9:38 AM

## 2015-12-12 NOTE — Progress Notes (Signed)
Initial Nutrition Assessment  DOCUMENTATION CODES:   Non-severe (moderate) malnutrition in context of acute illness/injury  INTERVENTION:   Provide Ensure Enlive po BID, each supplement provides 350 kcal and 20 grams of protein Encourage PO intake RD to continue to monitor  NUTRITION DIAGNOSIS:   Inadequate oral intake related to poor appetite, other (see comment) (taste changes) as evidenced by per patient/family report.  GOAL:   Patient will meet greater than or equal to 90% of their needs  MONITOR:   PO intake, Supplement acceptance, Labs, Weight trends, I & O's  REASON FOR ASSESSMENT:   Consult Assessment of nutrition requirement/status  ASSESSMENT:   82 y.o. male with PMH of HTN, DM, Gout, Recent hospitalization for GI bleeding (5/26-5/29) presented with generalized weakness, poor oral intake for several days. Family brought him to the emeregency room due to concern for dehydration. Found to have hypomagnesemia, hypocalcemia and acute renal failure in ED. Patient denies acute abdominal pains, no diarrhea, no vomiting, but reports poor appetite and "food does not taste".  Pt in room with daughter at bedside. Pt drinking contrast for upcoming CT. Pt states he has "no taste", foods have no flavor. He states food just turns to mush in his mouth and he can't swallow it. Pt's daughter states he has been gagging on solid foods. Liquids have not been an issue. Pt eating 10-100% of heart healthy/CHO modified meals. Pt has chicken noodle soup and jello this morning. Denies any chewing issues. Pt is agreeable to drinking Ensure supplements, RD to order.  Per pt's daughter, she states the patient lost 15 lb over 2 months prior to previous admission at the end of May (7% wt loss x 2 months, significant for time frame). Pt with moderate fat depletion in arm region.   Medications: Mg-Ox tablet BID Labs reviewed: CBGs: 142-184 Low Mg  Diet Order:  Diet heart healthy/carb modified Room  service appropriate?: Yes; Fluid consistency:: Thin  Skin:  Reviewed, no issues  Last BM:  6/6  Height:   Ht Readings from Last 1 Encounters:  12/11/15 5\' 10"  (1.778 m)    Weight:   Wt Readings from Last 1 Encounters:  12/11/15 200 lb 13.4 oz (91.1 kg)    Ideal Body Weight:  75.5 kg  BMI:  Body mass index is 28.82 kg/(m^2).  Estimated Nutritional Needs:   Kcal:  2000-2200  Protein:  100-110g  Fluid:  2L/day  EDUCATION NEEDS:   Education needs addressed  Clayton Bibles, MS, RD, LDN Pager: [phone] After Hours Pager: [phone]

## 2015-12-13 ENCOUNTER — Observation Stay (HOSPITAL_COMMUNITY): Payer: Medicare Other

## 2015-12-13 DIAGNOSIS — R0600 Dyspnea, unspecified: Secondary | ICD-10-CM

## 2015-12-13 DIAGNOSIS — C787 Secondary malignant neoplasm of liver and intrahepatic bile duct: Secondary | ICD-10-CM | POA: Diagnosis present

## 2015-12-13 DIAGNOSIS — E119 Type 2 diabetes mellitus without complications: Secondary | ICD-10-CM | POA: Diagnosis present

## 2015-12-13 DIAGNOSIS — Z7984 Long term (current) use of oral hypoglycemic drugs: Secondary | ICD-10-CM | POA: Diagnosis not present

## 2015-12-13 DIAGNOSIS — C259 Malignant neoplasm of pancreas, unspecified: Secondary | ICD-10-CM | POA: Diagnosis present

## 2015-12-13 DIAGNOSIS — E669 Obesity, unspecified: Secondary | ICD-10-CM | POA: Diagnosis present

## 2015-12-13 DIAGNOSIS — R7989 Other specified abnormal findings of blood chemistry: Secondary | ICD-10-CM | POA: Diagnosis present

## 2015-12-13 DIAGNOSIS — M109 Gout, unspecified: Secondary | ICD-10-CM | POA: Diagnosis present

## 2015-12-13 DIAGNOSIS — Z888 Allergy status to other drugs, medicaments and biological substances status: Secondary | ICD-10-CM | POA: Diagnosis not present

## 2015-12-13 DIAGNOSIS — D509 Iron deficiency anemia, unspecified: Secondary | ICD-10-CM | POA: Diagnosis present

## 2015-12-13 DIAGNOSIS — N179 Acute kidney failure, unspecified: Secondary | ICD-10-CM | POA: Diagnosis present

## 2015-12-13 DIAGNOSIS — C251 Malignant neoplasm of body of pancreas: Secondary | ICD-10-CM | POA: Diagnosis not present

## 2015-12-13 DIAGNOSIS — I10 Essential (primary) hypertension: Secondary | ICD-10-CM | POA: Diagnosis present

## 2015-12-13 DIAGNOSIS — Z79899 Other long term (current) drug therapy: Secondary | ICD-10-CM | POA: Diagnosis not present

## 2015-12-13 DIAGNOSIS — R531 Weakness: Secondary | ICD-10-CM | POA: Diagnosis present

## 2015-12-13 DIAGNOSIS — C250 Malignant neoplasm of head of pancreas: Secondary | ICD-10-CM | POA: Diagnosis not present

## 2015-12-13 DIAGNOSIS — R591 Generalized enlarged lymph nodes: Secondary | ICD-10-CM | POA: Diagnosis present

## 2015-12-13 DIAGNOSIS — A09 Infectious gastroenteritis and colitis, unspecified: Secondary | ICD-10-CM | POA: Diagnosis not present

## 2015-12-13 DIAGNOSIS — R0602 Shortness of breath: Secondary | ICD-10-CM | POA: Diagnosis not present

## 2015-12-13 DIAGNOSIS — A047 Enterocolitis due to Clostridium difficile: Secondary | ICD-10-CM | POA: Diagnosis present

## 2015-12-13 DIAGNOSIS — C801 Malignant (primary) neoplasm, unspecified: Secondary | ICD-10-CM

## 2015-12-13 DIAGNOSIS — E860 Dehydration: Secondary | ICD-10-CM

## 2015-12-13 DIAGNOSIS — D630 Anemia in neoplastic disease: Secondary | ICD-10-CM | POA: Diagnosis present

## 2015-12-13 DIAGNOSIS — R160 Hepatomegaly, not elsewhere classified: Secondary | ICD-10-CM | POA: Diagnosis present

## 2015-12-13 DIAGNOSIS — Z6828 Body mass index (BMI) 28.0-28.9, adult: Secondary | ICD-10-CM | POA: Diagnosis not present

## 2015-12-13 DIAGNOSIS — R627 Adult failure to thrive: Secondary | ICD-10-CM | POA: Diagnosis present

## 2015-12-13 DIAGNOSIS — Z7189 Other specified counseling: Secondary | ICD-10-CM | POA: Diagnosis not present

## 2015-12-13 DIAGNOSIS — E8770 Fluid overload, unspecified: Secondary | ICD-10-CM | POA: Diagnosis present

## 2015-12-13 DIAGNOSIS — E785 Hyperlipidemia, unspecified: Secondary | ICD-10-CM | POA: Diagnosis present

## 2015-12-13 DIAGNOSIS — E440 Moderate protein-calorie malnutrition: Secondary | ICD-10-CM | POA: Diagnosis present

## 2015-12-13 DIAGNOSIS — Z87891 Personal history of nicotine dependence: Secondary | ICD-10-CM | POA: Diagnosis not present

## 2015-12-13 DIAGNOSIS — Z66 Do not resuscitate: Secondary | ICD-10-CM | POA: Diagnosis not present

## 2015-12-13 DIAGNOSIS — R600 Localized edema: Secondary | ICD-10-CM | POA: Diagnosis present

## 2015-12-13 LAB — ECHOCARDIOGRAM COMPLETE
Area-P 1/2: 5.12 cm2
E decel time: 194 ms
E/e' ratio: 8.93
FS: 32 % (ref 28–44)
Height: 70 in
IVS/LV PW RATIO, ED: 1.21
LA ID, A-P, ES: 37 mm
LA diam end sys: 37 mm
LA diam index: 1.77 cm/m2
LA vol A4C: 86.4 mL
LA vol index: 40.6 mL/m2
LA vol: 84.9 mL
LV E/e' medial: 8.93
LV E/e'average: 8.93
LV PW d: 16.8 mm — AB (ref 0.6–1.1)
LV e' LATERAL: 7.07 cm/s
MV Annulus VTI: 25.9 cm
MV Dec: 194
MV M vel: 70.2
MV pk A vel: 117 m/s
MV pk E vel: 63.1 m/s
Mean grad: 3 mmHg
P 1/2 time: 43 ms
TAPSE: 17.1 mm
TDI e' lateral: 7.07
TDI e' medial: 6.74
Weight: 3213.42 [oz_av]

## 2015-12-13 LAB — HEPATITIS PANEL, ACUTE
HCV Ab: 0.1 s/co ratio (ref 0.0–0.9)
HEP A IGM: NEGATIVE
HEP B S AG: NEGATIVE
Hep B C IgM: NEGATIVE

## 2015-12-13 LAB — MAGNESIUM: MAGNESIUM: 1.5 mg/dL — AB (ref 1.7–2.4)

## 2015-12-13 LAB — C DIFFICILE QUICK SCREEN W PCR REFLEX
C DIFFICILE (CDIFF) TOXIN: POSITIVE — AB
C DIFFICLE (CDIFF) ANTIGEN: POSITIVE — AB
C Diff interpretation: POSITIVE

## 2015-12-13 LAB — COMPREHENSIVE METABOLIC PANEL
ALT: 47 U/L (ref 17–63)
ANION GAP: 10 (ref 5–15)
AST: 123 U/L — AB (ref 15–41)
Albumin: 2.3 g/dL — ABNORMAL LOW (ref 3.5–5.0)
Alkaline Phosphatase: 296 U/L — ABNORMAL HIGH (ref 38–126)
BILIRUBIN TOTAL: 1.7 mg/dL — AB (ref 0.3–1.2)
BUN: 17 mg/dL (ref 6–20)
CO2: 20 mmol/L — ABNORMAL LOW (ref 22–32)
Calcium: 6.4 mg/dL — CL (ref 8.9–10.3)
Chloride: 108 mmol/L (ref 101–111)
Creatinine, Ser: 0.97 mg/dL (ref 0.61–1.24)
Glucose, Bld: 145 mg/dL — ABNORMAL HIGH (ref 65–99)
POTASSIUM: 3.8 mmol/L (ref 3.5–5.1)
Sodium: 138 mmol/L (ref 135–145)
TOTAL PROTEIN: 5.5 g/dL — AB (ref 6.5–8.1)

## 2015-12-13 LAB — CBC
HEMATOCRIT: 25.1 % — AB (ref 39.0–52.0)
Hemoglobin: 8.3 g/dL — ABNORMAL LOW (ref 13.0–17.0)
MCH: 29.1 pg (ref 26.0–34.0)
MCHC: 33.1 g/dL (ref 30.0–36.0)
MCV: 88.1 fL (ref 78.0–100.0)
Platelets: 182 10*3/uL (ref 150–400)
RBC: 2.85 MIL/uL — ABNORMAL LOW (ref 4.22–5.81)
RDW: 16.6 % — AB (ref 11.5–15.5)
WBC: 13.7 10*3/uL — ABNORMAL HIGH (ref 4.0–10.5)

## 2015-12-13 LAB — GLUCOSE, CAPILLARY
GLUCOSE-CAPILLARY: 150 mg/dL — AB (ref 65–99)
Glucose-Capillary: 136 mg/dL — ABNORMAL HIGH (ref 65–99)
Glucose-Capillary: 216 mg/dL — ABNORMAL HIGH (ref 65–99)
Glucose-Capillary: 171 mg/dL — ABNORMAL HIGH (ref 65–99)

## 2015-12-13 LAB — URIC ACID: Uric Acid, Serum: 8.9 mg/dL — ABNORMAL HIGH (ref 4.4–7.6)

## 2015-12-13 LAB — TSH: TSH: 4.363 u[IU]/mL (ref 0.350–4.500)

## 2015-12-13 LAB — PROTIME-INR
INR: 1.27 (ref 0.00–1.49)
Prothrombin Time: 16.1 seconds — ABNORMAL HIGH (ref 11.6–15.2)

## 2015-12-13 LAB — HIV ANTIBODY (ROUTINE TESTING W REFLEX): HIV Screen 4th Generation wRfx: NONREACTIVE

## 2015-12-13 LAB — PSA: PSA: 8.31 ng/mL — AB (ref 0.00–4.00)

## 2015-12-13 MED ORDER — BENZONATATE 100 MG PO CAPS
100.0000 mg | ORAL_CAPSULE | Freq: Three times a day (TID) | ORAL | Status: DC | PRN
  Administered 2015-12-13 – 2015-12-15 (×5): 100 mg via ORAL
  Filled 2015-12-13 (×5): qty 1

## 2015-12-13 MED ORDER — MIDAZOLAM HCL 2 MG/2ML IJ SOLN
INTRAMUSCULAR | Status: AC
  Filled 2015-12-13: qty 4

## 2015-12-13 MED ORDER — SODIUM CHLORIDE 0.9 % IV SOLN
1.0000 g | Freq: Once | INTRAVENOUS | Status: AC
  Administered 2015-12-13: 1 g via INTRAVENOUS
  Filled 2015-12-13 (×2): qty 10

## 2015-12-13 MED ORDER — CARVEDILOL 6.25 MG PO TABS
6.2500 mg | ORAL_TABLET | Freq: Two times a day (BID) | ORAL | Status: DC
  Administered 2015-12-13 – 2015-12-14 (×2): 6.25 mg via ORAL
  Filled 2015-12-13 (×2): qty 1

## 2015-12-13 MED ORDER — NALOXONE HCL 0.4 MG/ML IJ SOLN
INTRAMUSCULAR | Status: AC
  Filled 2015-12-13: qty 1

## 2015-12-13 MED ORDER — FENTANYL CITRATE (PF) 100 MCG/2ML IJ SOLN
INTRAMUSCULAR | Status: AC | PRN
  Administered 2015-12-13: 25 ug via INTRAVENOUS

## 2015-12-13 MED ORDER — FENTANYL CITRATE (PF) 100 MCG/2ML IJ SOLN
INTRAMUSCULAR | Status: AC
  Filled 2015-12-13: qty 2

## 2015-12-13 MED ORDER — MIDAZOLAM HCL 2 MG/2ML IJ SOLN
INTRAMUSCULAR | Status: AC | PRN
  Administered 2015-12-13 (×2): 0.5 mg via INTRAVENOUS

## 2015-12-13 MED ORDER — HYDRALAZINE HCL 20 MG/ML IJ SOLN
5.0000 mg | Freq: Four times a day (QID) | INTRAMUSCULAR | Status: DC | PRN
Start: 2015-12-13 — End: 2015-12-18

## 2015-12-13 MED ORDER — VANCOMYCIN 50 MG/ML ORAL SOLUTION
125.0000 mg | Freq: Four times a day (QID) | ORAL | Status: DC
  Administered 2015-12-13 – 2015-12-18 (×19): 125 mg via ORAL
  Filled 2015-12-13 (×23): qty 2.5

## 2015-12-13 MED ORDER — FLUMAZENIL 0.5 MG/5ML IV SOLN
INTRAVENOUS | Status: AC
  Filled 2015-12-13: qty 5

## 2015-12-13 NOTE — Sedation Documentation (Signed)
Patient denies pain and is resting comfortably.  

## 2015-12-13 NOTE — Progress Notes (Addendum)
CRITICAL VALUE ALERT  Critical value received:  Calcium 6.4  Date of notification:  12/13/15  Time of notification:  0635  Critical value read back: Yes   Nurse who received alert: Elwin Sleight RN   MD notified (1st page):  Raliegh Ip Schorr  Time of first page:  [phone]  MD notified (2nd page):  Time of second page:   Responding MD:  Lamar Blinks  Time MD responded:  [phone]

## 2015-12-13 NOTE — Consult Note (Signed)
Chief Complaint: Patient was seen in consultation today for ultrasound-guided liver lesion biopsy Chief Complaint  Patient presents with  . Weakness    Referring Physician(s): Xu,Fang  Supervising Physician: Marybelle Killings  Patient Status: In-pt   History of Present Illness: [name] [name] is a 82 y.o. male with history of hypertension, diabetes, gout, hyperlipidemia who was recently admitted to the hospital from 5/26 -12/04/15 for GI bleeding. He underwent both endoscopy and colonoscopy and had  APC of a nonbleeding angiodysplastic lesion in the stomach. He presented to the hospital again on 6/5 with weakness, decreased oral intake, diminished appetite. Laboratory evaluation revealed acute renal failure as well as elevated LFTs. Further imaging revealed widespread hepatic metastatic disease with associated porta hepatis and upper abdominal lymphadenopathy, small bilateral pleural effusions , a 2.6 cm pancreatic body mass and enlarged and heterogeneous prostate. Request now received for image guided liver lesion biopsy for further evaluation.  Past Medical History  Diagnosis Date  . Hypertension   . Diabetes mellitus without complication ([place])   . Gout   . Hyperlipidemia     Past Surgical History  Procedure Laterality Date  . Esophagogastroduodenoscopy N/A 12/03/2015    Procedure: ESOPHAGOGASTRODUODENOSCOPY (EGD);  Surgeon: Milus Banister, MD;  Location: [facility];  Service: Endoscopy;  Laterality: N/A;  . Colonoscopy N/A 12/04/2015    Procedure: COLONOSCOPY;  Surgeon: Milus Banister, MD;  Location: [facility];  Service: Endoscopy;  Laterality: N/A;    Allergies: Ace inhibitors; Capoten; Glucophage; and Vasotec  Medications: Prior to Admission medications   Medication Sig Start Date End Date Taking? Authorizing Provider  atorvastatin (LIPITOR) 20 MG tablet Take 20 mg by mouth daily.   Yes Historical Provider, MD  colchicine 0.6 MG tablet Take 2 pills now, then 1  pill in an hour. Wait 3 days before repeating if your gout attack continues. 11/30/15  Yes Jaynee Eagles, PA-C  glipiZIDE (GLUCOTROL XL) 10 MG 24 hr tablet Take 10 mg by mouth daily with breakfast.   Yes Historical Provider, MD  latanoprost (XALATAN) 0.005 % ophthalmic solution Place 1 drop into both eyes at bedtime. 11/13/15  Yes Historical Provider, MD  losartan (COZAAR) 100 MG tablet Take 100 mg by mouth every morning.    Yes Historical Provider, MD  metFORMIN (GLUCOPHAGE) 500 MG tablet Take 1,000 mg by mouth every evening.    Yes Historical Provider, MD  pantoprazole (PROTONIX) 40 MG tablet Take 1 tablet (40 mg total) by mouth daily. 12/04/15  Yes Kelvin Cellar, MD  saxagliptin HCl (ONGLYZA) 5 MG TABS tablet Take 5 mg by mouth daily.   Yes Historical Provider, MD  terazosin (HYTRIN) 5 MG capsule Take 5 mg by mouth at bedtime.    Yes Historical Provider, MD  VESICARE 5 MG tablet Take 5 mg by mouth daily. 11/27/15  Yes Historical Provider, MD     History reviewed. No pertinent family history.  Social History   Social History  . Marital Status: Married    Spouse Name: N/A  . Number of Children: N/A  . Years of Education: N/A   Social History Main Topics  . Smoking status: Former Research scientist (life sciences)  . Smokeless tobacco: Never Used  . Alcohol Use: No  . Drug Use: No  . Sexual Activity: No   Other Topics Concern  . None   Social History Narrative       Review of Systems patient currently denies headache, high fever, chills, chest pain, dyspnea, abdominal pain, back pain, nausea,  vomiting. He does endorse weakness, occasional cough, few loose stools.. He denies any further GI bleeding.  Vital Signs: BP 152/97 mmHg  Pulse 96  Temp(Src) 99.2 F (37.3 C) (Oral)  Resp 18  Ht 5\' 10"  (1.778 m)  Wt 200 lb 13.4 oz (91.1 kg)  BMI 28.82 kg/m2  SpO2 99%  Physical Exam patient awake, alert. Chest with slightly diminished breath sounds at bases. Heart with regular rate and rhythm. Abdomen obese,  mildly distended, positive bowel sounds, nontender. Trace left lower extremity edema, no right lower extremity edema.  Mallampati Score:     Imaging: Ct Abdomen Pelvis Wo Contrast  12/12/2015  CLINICAL DATA:  82 year old male with abnormal LFTs. Multiple liver masses detected on recent ultrasound. Initial encounter. EXAM: CT ABDOMEN AND PELVIS WITHOUT CONTRAST TECHNIQUE: Multidetector CT imaging of the abdomen and pelvis was performed following the standard protocol without IV contrast. COMPARISON:  Abdomen ultrasound 12/12/2015. FINDINGS: Small bilateral layering pleural effusions. Right lung base atelectasis. No suspicious lung base nodule or mass identified. Bulky endplate degenerative spurring in the lower thoracic spine. Advanced lower lumbar disc and facet degeneration. No acute or suspicious osseous lesion identified. Mild nonspecific presacral stranding. The prostate appears enlarged and heterogeneous (note heterogeneity at the superior aspect of the prostate on series 2, image 79). The gland encompasses 6-7 cm diameter. The urinary bladder is largely decompressed. There is a large 4.4 cm oval lamellated bladder stone on the right (series 2, image 77). There are superimposed small calcified pelvic phleboliths. There is mild rectal wall thickening. The rectum is mildly distended with low-density stool. The distal sigmoid is decompressed. The proximal sigmoid is redundant and mildly gas distended. There is moderate diverticulosis at the junction of the sigmoid and descending colon segments. There is a small volume of free fluid in the left gutter. The left colon otherwise appears negative. Mild motion artifact at the transverse colon which appears negative aside from retained stool. Mild to moderate diverticulosis of the right colon. Negative appendix. Oral contrast has almost reached the terminal ileum. No dilated small bowel. Oral contrast distends the stomach which otherwise appears normal. No  definite duodenal abnormality. Small volume of free fluid in the right gutter and at the inferior right liver tip, greater than the volume of fluid on the left. Enlarged and heterogeneous liver containing numerous hypodense masses, which coalesce 2 lesions up to 8 cm diameter. All liver lobes appear affected. Small volume perihepatic fluid anteriorly and at the dome. The gallbladder appears contracted with wall thickening and pericholecystic fluid, but no significant inflammatory stranding emanating from the gallbladder fossa. Noncontrast spleen is within normal limits. There is evidence of a 2.5 cm hypodense mass at the body of the pancreas as seen on series 2, image 36. No pancreatic ductal dilatation is evident. The head and uncinate process have a more normal appearance, although there is associated peripancreatic lymphadenopathy including up to 2.8 cm abnormal nodes or ex nodal disease at the portal caval station (series 2, image 35). Mild thickening of the adrenal glands which otherwise have a negative noncontrast appearance. Nonspecific mild to moderate perinephric stranding. There is a a oval mildly lobulated 16 mm kidney stone at the left lower pole with no associated left hydronephrosis. No definite hydroureter. Tortuous distal abdominal aorta with calcified atherosclerosis. Tortuous proximal iliac arteries. Vascular patency not evaluated in the absence of IV contrast. IMPRESSION: 1. Noncontrast study demonstrating evidence of widespread hepatic metastatic disease. Associated porta hepatis and upper abdominal lymphadenopathy. Small volume abdominal  free fluid. Small bilateral pleural effusions. 2. Evidence of a 2.6 cm hypodense pancreatic body mass, such that metastatic pancreatic tumor is the favored unifying diagnosis at this time. 3. However, the above findings would be much better characterized on an IV contrast-enhanced study. In the inpatient setting Pancreas protocol CT of the Abdomen with IV  contrast may be most appropriate. 4. Enlarged and heterogeneous prostate also noted, such that the main differential consideration may be metastatic prostate cancer. However, there is no evidence of metastatic disease to the skeleton. 5. Incidentally noted large bladder calculus and left lower pole nonobstructing renal calculus. 6. Calcified aortic atherosclerosis. Electronically Signed   By: Genevie Ann M.D.   On: 12/12/2015 15:00   Dg Chest 2 View  12/11/2015  CLINICAL DATA:  82 year old male with recent increase in weakness. Cough, loss of appetite. EXAM: CHEST  2 VIEW COMPARISON:  Chest x-ray 12/01/2015. FINDINGS: Mild elevation of the right hemidiaphragm (chronic and unchanged). Lung volumes are normal. No consolidative airspace disease. No pleural effusions. No pneumothorax. No pulmonary nodule or mass noted. Pulmonary vasculature and the cardiomediastinal silhouette are within normal limits. Atherosclerosis in the thoracic aorta. IMPRESSION: 1. No radiographic evidence of acute cardiopulmonary disease. Electronically Signed   By: Vinnie Langton M.D.   On: 12/11/2015 08:39   Dg Chest 2 View  12/01/2015  CLINICAL DATA:  Acute onset of bilateral leg weakness. Initial encounter. EXAM: CHEST  2 VIEW COMPARISON:  Chest radiograph performed 09/12/2013 FINDINGS: The lungs are mildly hypoexpanded. There is mild elevation of the right hemidiaphragm. There is no evidence of focal opacification, pleural effusion or pneumothorax. The heart is borderline normal in size. No acute osseous abnormalities are seen. IMPRESSION: Lungs mildly hypoexpanded but grossly clear, with mild elevation of the right hemidiaphragm. Electronically Signed   By: Garald Balding M.D.   On: 12/01/2015 21:31   Ct Head Wo Contrast  12/01/2015  CLINICAL DATA:  Weakness.  Dizziness for several days. EXAM: CT HEAD WITHOUT CONTRAST TECHNIQUE: Contiguous axial images were obtained from the base of the skull through the vertex without intravenous  contrast. COMPARISON:  Head CT 09/12/2013 FINDINGS: No intracranial hemorrhage, mass effect, or midline shift. The degree of atrophy and chronic small vessel ischemia is unchanged from prior exam. No hydrocephalus. The basilar cisterns are patent. No evidence of territorial infarct. No intracranial fluid collection. Atherosclerosis of skullbase vasculature. Calvarium is intact. Included paranasal sinuses and mastoid air cells are well aerated. Improved mucosal thickening of the ethmoid air cells and maxillary sinuses. IMPRESSION: 1.  No acute intracranial abnormality. 2. Atrophy and chronic small vessel ischemia, unchanged from prior exam. Electronically Signed   By: Jeb Levering M.D.   On: 12/01/2015 21:40   US Renal  12/12/2015  CLINICAL DATA:  Increased BUN and creatinine, edema EXAM: RENAL / URINARY TRACT ULTRASOUND COMPLETE COMPARISON:  None. FINDINGS: Right Kidney: Length: 11 cm. Echogenicity within normal limits. No mass or hydronephrosis visualized. Left Kidney: Length: 11.4 cm. Normal echogenicity with no hydronephrosis. 2 x 2 x 2.2 cm hypoechoic mass interpolar region left kidney. 15 mm echogenic focus with shadowing in the lower pole suggesting stone. Bladder: 4.2 cm echogenic focus within the bladder causing posterior acoustic shadowing possibly representing a large calculus. Prostate is enlarged at 65 x 64 x 67 mm. IMPRESSION: 1. Indeterminate left renal mass versus cyst. If the patient cannot undergo contrast-enhanced CT scan consider renal protocol MRI. 2. Nonobstructing 15 mm left renal stone 3. 4 cm bladder calculus.  Enlargement  of the prostate. Electronically Signed   By: Skipper Cliche M.D.   On: 12/12/2015 08:57   Dg Foot 2 Views Left  11/30/2015  CLINICAL DATA:  Swollen, painful left great toe EXAM: LEFT FOOT - 2 VIEW COMPARISON:  None. FINDINGS: Tarsal-metatarsal alignment is normal. No fracture is seen. No definite erosion is noted. Joint spaces are relatively well preserved for  age. There is faint opacity within the soft tissues adjacent to the left first metatarsal head which could represent faint calcification possibly indicative of gout. There is pes planus present. There are degenerative changes in the midfoot with degenerative calcaneal spurs also noted. IMPRESSION: 1. No acute abnormality. 2. Questionable faint calcification in the soft tissues adjacent to the left first MTP joint could indicate gout but no erosions are noted. 3. Pes planus with degenerative changes in the mid and hindfoot. Electronically Signed   By: Ivar Drape M.D.   On: 11/30/2015 15:12   US Abdomen Limited Ruq  12/12/2015  CLINICAL DATA:  82 year old male inpatient with elevated liver function tests. EXAM: US ABDOMEN LIMITED - RIGHT UPPER QUADRANT COMPARISON:  None. FINDINGS: Gallbladder: Contracted gallbladder, limiting gallbladder evaluation. No definite gallbladder wall thickening accounting for the contracted state. No gallstones, pericholecystic fluid or sonographic Murphy sign. Common bile duct: Diameter: 2 mm Liver: There are multiple (greater than 5) hypoechoic liver masses scattered throughout the liver measuring up to 6.3 x 5.7 x 6.4 cm in the right liver lobe. The background liver parenchymal echotexture and echogenicity are normal. Trace perihepatic ascites. IMPRESSION: 1. Multiple indeterminate hypoechoic liver masses scattered throughout the liver measuring up to 6.4 cm in the right liver lobe, worrisome for liver metastases. Further evaluation with CT abdomen/pelvis with IV and oral contrast is recommended. 2. Trace perihepatic ascites. 3. Contracted gallbladder with no gallstones. Electronically Signed   By: Ilona Sorrel M.D.   On: 12/12/2015 09:00    Labs:  CBC:  Recent Labs  12/04/15 0746 12/11/15 0816 12/12/15 0449 12/13/15 0502  WBC 10.4 12.5* 12.2* 13.7*  HGB 9.4* 8.7* 8.0* 8.3*  HCT 27.8* 25.9* 23.8* 25.1*  PLT 215 191 185 182    COAGS:  Recent Labs  12/01/15 2339  12/13/15 0502  INR 1.20 1.27  APTT 32  --     BMP:  Recent Labs  12/03/15 0510 12/11/15 0816 12/12/15 0449 12/13/15 0502  NA 139 140 140 138  K 3.7 3.8 3.9 3.8  CL 109 107 109 108  CO2 22 21* 21* 20*  GLUCOSE 96 154* 141* 145*  BUN 26* 30* 23* 17  CALCIUM 6.2* 6.1* 6.1* 6.4*  CREATININE 1.03 1.82* 1.37* 0.97  GFRNONAA >60 32* 46* >60  GFRAA >60 38* 53* >60    LIVER FUNCTION TESTS:  Recent Labs  12/02/15 0512 12/11/15 0816 12/12/15 0449 12/13/15 0502  BILITOT 0.7 1.5* 1.6* 1.7*  AST 106* 121* 120* 123*  ALT 41 47 45 47  ALKPHOS 214* 285* 253* 296*  PROT 5.9* 5.8* 5.4* 5.5*  ALBUMIN 2.7* 2.5* 2.3* 2.3*    TUMOR MARKERS: No results for input(s): AFPTM, CEA, CA199, CHROMGRNA in the last 8760 hours.  Assessment and Plan: 82 y.o. male with history of hypertension, diabetes, gout, hyperlipidemia who was recently admitted to the hospital from 5/26 -12/04/15 for GI bleeding. He underwent both endoscopy and colonoscopy and had  APC of a nonbleeding angiodysplastic lesion in the stomach. He presented to the hospital again on 6/5 with weakness, decreased oral intake, diminished appetite. Laboratory evaluation  revealed acute renal failure as well as elevated LFTs. Further imaging revealed widespread hepatic metastatic disease with associated porta hepatis and upper abdominal lymphadenopathy, small bilateral pleural effusions , a 2.6 cm pancreatic body mass and enlarged and heterogeneous prostate. Request now received for image guided liver lesion biopsy for further evaluation. Imaging studies were reviewed by Dr. Barbie Banner. Risks and benefits discussed with the patient/daughter including, but not limited to bleeding, infection, damage to adjacent structures or low yield requiring additional tests.All of the patient's questions were answered, patient is agreeable to proceed.Consent signed and in chart. Procedure scheduled for today.     Thank you for this interesting consult.  I  greatly enjoyed meeting [name] [name] and look forward to participating in their care.  A copy of this report was sent to the requesting provider on this date.  Electronically Signed: D. Rowe Robert 12/13/2015, 10:15 AM   I spent a total of  30 minutes   in face to face in clinical consultation, greater than 50% of which was counseling/coordinating care for image guided liver lesion biopsy

## 2015-12-13 NOTE — Progress Notes (Addendum)
CRITICAL VALUE ALERT  Critical value received:  +cdiff   Date of notification:  12/13/15  Time of notification:  2138  Critical value read back: Yes   Nurse who received alert:  Elwin Sleight RN   MD notified (1st page):  Raliegh Ip Schorr   Time of first page:  2142  MD notified (2nd page):  Time of second page:  Responding MD:  Lamar Blinks   Time MD responded:  2156

## 2015-12-13 NOTE — Progress Notes (Signed)
*  PRELIMINARY RESULTS* Echocardiogram 2D Echocardiogram has been performed.  Leavy Cella 12/13/2015, 11:17 AM

## 2015-12-13 NOTE — Progress Notes (Signed)
PROGRESS NOTE  [name] [name] [id number] DOB: Mar 10, 1929 DOA: 12/11/2015 PCP: No PCP Per Patient   Assessment/Plan: Active Problems:   AKI (acute kidney injury) ([place])   Dehydration   Malnutrition of moderate degree   AKI, dehydration, hypomagnesemia, hypocalcemia.  -Poor oral intake.  -Failure to thrive likely secondary to malignancy with recent imaging revealing evidence of possible metastatic pancreatic cancer. -Physical therapy consulted -Creatinine normalizing, will encourage by mouth intake, stop IV fluids  Suspected metastatic cancer.  bili-1.5, alk phos-285, AST-121. abd exam: benign. No abdominal pain,. Lipase wnl,   liver US : 1. Multiple indeterminate hypoechoic liver masses scattered throughout the liver measuring up to 6.4 cm in the right liver lobe, worrisome for liver metastases.  -CT scan of abdomen and pelvis performed on 12/12/2015 revealing widespread hepatic metastatic disease, with radiology reporting pancreatic body mass and that there is suspicion for findings to represent metastatic pancreatic cancer. -He underwent ultrasound-guided biopsy of liver lesion on 12/13/2015, tolerated procedure well.  noninsulin dependent DM2. Hold oral meds, consider d/c metformin if renal function does not improve. a1c 7.1; monitor on ISS  Left leg edema. L>R. [place] no DVT.  echo pending, currently overall dehydrated,   anemia:  -iron deficiency: Recent hospitalization in 11/2015 for GI bleeding (5/26-5/29). EGD: non bleeding angiodysplastic lesion s/p coagulation. (APM) -no s/s of acute bleeding, cont monitor, PPI  HTN -Blood pressures remain elevated, will increase Coreg to 6.25 mg by mouth twice a day  Code Status: Full code  Family Communication: Spoke with daughter present at bedside  Disposition Plan: Physical therapy recommending skilled nursing facility placement   Consultants:  Interventional radiology  Procedures:  Ultrasound-guided biopsy of liver  lesion on 12/13/2015  Antibiotics:  none   Objective: BP 163/100 mmHg  Pulse 99  Temp(Src) 98.7 F (37.1 C) (Oral)  Resp 18  Ht '5\' 10"'$  (1.778 m)  Wt 91.1 kg (200 lb 13.4 oz)  BMI 28.82 kg/m2  SpO2 100%  Intake/Output Summary (Last 24 hours) at 12/13/15 1423 Last data filed at 12/13/15 6415  Gross per 24 hour  Intake 2338.33 ml  Output   1640 ml  Net 698.33 ml   Filed Weights   12/11/15 1252  Weight: 91.1 kg (200 lb 13.4 oz)    Exam:   General:  Frail, Chronically ill-appearing  Cardiovascular: RRR  Respiratory: CTABL  Abdomen: Soft/ND/NT, positive BS  Musculoskeletal: left leg edema  Neuro:aaox3, flat affect, mild psychomotor retardation ( daughter reported patient's wife died two years ago and a grandchild died last year, patient has not been motivated to do much for the last two years), patient denies depression.   Data Reviewed: Basic Metabolic Panel:  Recent Labs Lab 12/11/15 0816 12/12/15 0449 12/13/15 0502  NA 140 140 138  K 3.8 3.9 3.8  CL 107 109 108  CO2 21* 21* 20*  GLUCOSE 154* 141* 145*  BUN 30* 23* 17  CREATININE 1.82* 1.37* 0.97  CALCIUM 6.1* 6.1* 6.4*  MG 0.9* 1.5* 1.5*   Liver Function Tests:  Recent Labs Lab 12/11/15 0816 12/12/15 0449 12/13/15 0502  AST 121* 120* 123*  ALT 47 45 47  ALKPHOS 285* 253* 296*  BILITOT 1.5* 1.6* 1.7*  PROT 5.8* 5.4* 5.5*  ALBUMIN 2.5* 2.3* 2.3*    Recent Labs Lab 12/11/15 1352  LIPASE 29   No results for input(s): AMMONIA in the last 168 hours. CBC:  Recent Labs Lab 12/11/15 0816 12/12/15 0449 12/13/15 0502  WBC 12.5* 12.2* 13.7*  HGB  8.7* 8.0* 8.3*  HCT 25.9* 23.8* 25.1*  MCV 86.6 87.2 88.1  PLT 191 185 182   Cardiac Enzymes:    Recent Labs Lab 12/11/15 1352  CKTOTAL 200   BNP (last 3 results) No results for input(s): BNP in the last 8760 hours.  ProBNP (last 3 results) No results for input(s): PROBNP in the last 8760 hours.  CBG:  Recent Labs Lab  12/12/15 0747 12/12/15 1216 12/12/15 1737 12/12/15 2134 12/13/15 1217  GLUCAP 142* 184* 205* 184* 150*    No results found for this or any previous visit (from the past 240 hour(s)).   Studies: Ct Abdomen Pelvis Wo Contrast  12/12/2015  CLINICAL DATA:  82 year old male with abnormal LFTs. Multiple liver masses detected on recent ultrasound. Initial encounter. EXAM: CT ABDOMEN AND PELVIS WITHOUT CONTRAST TECHNIQUE: Multidetector CT imaging of the abdomen and pelvis was performed following the standard protocol without IV contrast. COMPARISON:  Abdomen ultrasound 12/12/2015. FINDINGS: Small bilateral layering pleural effusions. Right lung base atelectasis. No suspicious lung base nodule or mass identified. Bulky endplate degenerative spurring in the lower thoracic spine. Advanced lower lumbar disc and facet degeneration. No acute or suspicious osseous lesion identified. Mild nonspecific presacral stranding. The prostate appears enlarged and heterogeneous (note heterogeneity at the superior aspect of the prostate on series 2, image 79). The gland encompasses 6-7 cm diameter. The urinary bladder is largely decompressed. There is a large 4.4 cm oval lamellated bladder stone on the right (series 2, image 77). There are superimposed small calcified pelvic phleboliths. There is mild rectal wall thickening. The rectum is mildly distended with low-density stool. The distal sigmoid is decompressed. The proximal sigmoid is redundant and mildly gas distended. There is moderate diverticulosis at the junction of the sigmoid and descending colon segments. There is a small volume of free fluid in the left gutter. The left colon otherwise appears negative. Mild motion artifact at the transverse colon which appears negative aside from retained stool. Mild to moderate diverticulosis of the right colon. Negative appendix. Oral contrast has almost reached the terminal ileum. No dilated small bowel. Oral contrast distends the  stomach which otherwise appears normal. No definite duodenal abnormality. Small volume of free fluid in the right gutter and at the inferior right liver tip, greater than the volume of fluid on the left. Enlarged and heterogeneous liver containing numerous hypodense masses, which coalesce 2 lesions up to 8 cm diameter. All liver lobes appear affected. Small volume perihepatic fluid anteriorly and at the dome. The gallbladder appears contracted with wall thickening and pericholecystic fluid, but no significant inflammatory stranding emanating from the gallbladder fossa. Noncontrast spleen is within normal limits. There is evidence of a 2.5 cm hypodense mass at the body of the pancreas as seen on series 2, image 36. No pancreatic ductal dilatation is evident. The head and uncinate process have a more normal appearance, although there is associated peripancreatic lymphadenopathy including up to 2.8 cm abnormal nodes or ex nodal disease at the portal caval station (series 2, image 35). Mild thickening of the adrenal glands which otherwise have a negative noncontrast appearance. Nonspecific mild to moderate perinephric stranding. There is a a oval mildly lobulated 16 mm kidney stone at the left lower pole with no associated left hydronephrosis. No definite hydroureter. Tortuous distal abdominal aorta with calcified atherosclerosis. Tortuous proximal iliac arteries. Vascular patency not evaluated in the absence of IV contrast. IMPRESSION: 1. Noncontrast study demonstrating evidence of widespread hepatic metastatic disease. Associated porta hepatis and upper  abdominal lymphadenopathy. Small volume abdominal free fluid. Small bilateral pleural effusions. 2. Evidence of a 2.6 cm hypodense pancreatic body mass, such that metastatic pancreatic tumor is the favored unifying diagnosis at this time. 3. However, the above findings would be much better characterized on an IV contrast-enhanced study. In the inpatient setting  Pancreas protocol CT of the Abdomen with IV contrast may be most appropriate. 4. Enlarged and heterogeneous prostate also noted, such that the main differential consideration may be metastatic prostate cancer. However, there is no evidence of metastatic disease to the skeleton. 5. Incidentally noted large bladder calculus and left lower pole nonobstructing renal calculus. 6. Calcified aortic atherosclerosis. Electronically Signed   By: Genevie Ann M.D.   On: 12/12/2015 15:00   US Biopsy  12/13/2015  INDICATION: Liver lesions EXAM: ULTRASOUND-GUIDED BIOPSY OF A LIVER LESION.  CORE. MEDICATIONS: None. ANESTHESIA/SEDATION: Fentanyl 25 mcg IV; Versed 1 mg IV Moderate Sedation Time:  14 The patient was continuously monitored during the procedure by the interventional radiology nurse under my direct supervision. FLUOROSCOPY TIME:  Fluoroscopy Time:  minutes  seconds ( mGy). COMPLICATIONS: None immediate. PROCEDURE: Informed written consent was obtained from the patient after a thorough discussion of the procedural risks, benefits and alternatives. All questions were addressed. Maximal Sterile Barrier Technique was utilized including caps, mask, sterile gowns, sterile gloves, sterile drape, hand hygiene and skin antiseptic. A timeout was performed prior to the initiation of the procedure. The right flank was prepped with ChloraPrep in a sterile fashion, and a sterile drape was applied covering the operative field. A sterile gown and sterile gloves were used for the procedure. Under sonographic guidance, an 30 gauge guide needle was advanced into the right lobe of the liver, within a lesion. Subsequently 2 18 gauge core biopsies were obtained. Gel-Foam slurry was injected into the tract. The guide needle was removed. Final imaging was performed. Patient tolerated the procedure well without complication. Vital sign monitoring by nursing staff during the procedure will continue as patient is in the special procedures unit for  post procedure observation. FINDINGS: The images document guide needle placement within the right lobe liver lesion. Post biopsy images demonstrate no hemorrhage. IMPRESSION: Successful ultrasound-guided core biopsy of a right lobe liver lesion. Electronically Signed   By: Marybelle Killings M.D.   On: 12/13/2015 13:09    Scheduled Meds: . antiseptic oral rinse  7 mL Mouth Rinse q12n4p  . calcium-vitamin D  1 tablet Oral Q breakfast  . carvedilol  6.25 mg Oral BID WC  . chlorhexidine  15 mL Mouth Rinse BID  . feeding supplement (ENSURE ENLIVE)  237 mL Oral BID BM  . insulin aspart  0-9 Units Subcutaneous TID WC  . latanoprost  1 drop Both Eyes QHS  . magnesium oxide  400 mg Oral BID  . pantoprazole  40 mg Oral Daily  . sodium chloride flush  3 mL Intravenous Q12H  . terazosin  5 mg Oral QHS    Continuous Infusions: . sodium chloride 100 mL/hr at 12/13/15 2683     Time spent: 30 mins  Kelvin Cellar MD  [facility] Pager [phone]. If 7PM-7AM, please contact night-coverage at [email or web address], password [email or web address] 12/13/2015, 2:23 PM

## 2015-12-13 NOTE — Evaluation (Signed)
Physical Therapy Evaluation Patient Details Name: [name] [name] MRN: [id number] DOB: 30-Apr-1929 Today's Date: 12/13/2015   History of Present Illness  82 y.o. male with PMH of HTN, DM, Gout, Recent hospitalization for GI bleeding (5/26-5/29) presented with generalized weakness, diagnosed with AKI, dehydration, hypomagnesemia, hypocalcemia and liver masses.  Clinical Impression  Pt admitted with above diagnosis. Pt currently with functional limitations due to the deficits listed below (see PT Problem List).  Pt will benefit from skilled PT to increase their independence and safety with mobility to allow discharge to the venue listed below.   Pt requiring min-mod assist for mobility however improved during session.  Daughter present and reports pt may be alone during day if d/c home.  Daughter considering HHPT vs SNF however needed to talk with family for decision.     Follow Up Recommendations Supervision/Assistance - 24 hour;SNF    Equipment Recommendations  Rolling walker with 5" wheels;3in1 (PT)    Recommendations for Other Services       Precautions / Restrictions Precautions Precautions: Fall      Mobility  Bed Mobility Overal bed mobility: Needs Assistance Bed Mobility: Supine to Sit;Sit to Supine     Supine to sit: Min assist Sit to supine: Mod assist   General bed mobility comments: assist for trunk upright and LEs onto bed  Transfers Overall transfer level: Needs assistance Equipment used: Rolling walker (2 wheeled) Transfers: Sit to/from [facility] Sit to Stand: Mod assist Stand pivot transfers: Mod assist       General transfer comment: verbal cues for safety and technique, assist to rise and steady as well as control descent, transferred to/from [facility] improved to min assist  Ambulation/Gait Ambulation/Gait assistance: Min assist Ambulation Distance (Feet): 160 Feet Assistive device: Rolling walker (2 wheeled) Gait Pattern/deviations:  Step-through pattern;Decreased stride length Gait velocity: decr   General Gait Details: slow but steady gait with RW, initially min assist however improved to min/guard  Stairs            Wheelchair Mobility    Modified Rankin (Stroke Patients Only)       Balance                                             Pertinent Vitals/Pain Pain Assessment: No/denies pain    Home Living Family/patient expects to be discharged to:: Private residence Living Arrangements: Children (daughter) Available Help at Discharge: Family;Available PRN/intermittently (daughter works) Type of Home: House Home Access: Stairs to enter   Technical brewer of Steps: 2-3 Home Layout: One level Home Equipment: Cane - single point      Prior Function Level of Independence: Independent with assistive device(s)               Hand Dominance        Extremity/Trunk Assessment   Upper Extremity Assessment: Generalized weakness           Lower Extremity Assessment: Generalized weakness         Communication   Communication: HOH  Cognition Arousal/Alertness: Awake/alert Behavior During Therapy: Flat affect Overall Cognitive Status: Within Functional Limits for tasks assessed                      General Comments      Exercises        Assessment/Plan    PT Assessment  Patient needs continued PT services  PT Diagnosis Difficulty walking;Generalized weakness   PT Problem List Decreased strength;Decreased activity tolerance;Decreased mobility;Decreased knowledge of use of DME  PT Treatment Interventions DME instruction;Gait training;Functional mobility training;Therapeutic activities;Therapeutic exercise;Stair training;Patient/family education   PT Goals (Current goals can be found in the Care Plan section) Acute Rehab PT Goals PT Goal Formulation: With patient Time For Goal Achievement: 12/20/15 Potential to Achieve Goals: Good     Frequency Min 3X/week   Barriers to discharge        Co-evaluation               End of Session Equipment Utilized During Treatment: Gait belt Activity Tolerance: Patient tolerated treatment well Patient left: in bed;with call bell/phone within reach;with family/visitor present      Functional Assessment Tool Used: clinical judgement Functional Limitation: Mobility: Walking and moving around Mobility: Walking and Moving Around Current Status [id number]): At least 40 percent but less than 60 percent impaired, limited or restricted Mobility: Walking and Moving Around Goal Status [phone]): At least 1 percent but less than 20 percent impaired, limited or restricted    Time: 0941-1004 PT Time Calculation (min) (ACUTE ONLY): 23 min   Charges:   PT Evaluation $PT Eval Moderate Complexity: 1 Procedure     PT G Codes:   PT G-Codes **NOT FOR INPATIENT CLASS** Functional Assessment Tool Used: clinical judgement Functional Limitation: Mobility: Walking and moving around Mobility: Walking and Moving Around Current Status [id number]): At least 40 percent but less than 60 percent impaired, limited or restricted Mobility: Walking and Moving Around Goal Status [phone]): At least 1 percent but less than 20 percent impaired, limited or restricted    Gaspard Isbell,KATHrine E 12/13/2015, 1:13 PM Carmelia Bake, PT, DPT 12/13/2015 Pager: [phone]

## 2015-12-13 NOTE — Care Management Obs Status (Signed)
[place] NOTIFICATION   Patient Details  Name: [name] [name] MRN: [id number] Date of Birth: 1928/11/15   Medicare Observation Status Notification Given:  Yes    McGibboneyOletta Darter, RN 12/13/2015, 9:13 AM

## 2015-12-13 NOTE — Procedures (Signed)
Liver lesion Bx R lobe 18 g times 2 No comp/EBL

## 2015-12-13 NOTE — Progress Notes (Signed)
Status post liver biopsy, bedrest completed, vital signs monitored, biopsy site dressing dry  and intact, no hematoma nor bleeding noted from the site .

## 2015-12-14 ENCOUNTER — Inpatient Hospital Stay (HOSPITAL_COMMUNITY): Payer: Medicare Other

## 2015-12-14 DIAGNOSIS — A0472 Enterocolitis due to Clostridium difficile, not specified as recurrent: Secondary | ICD-10-CM | POA: Diagnosis present

## 2015-12-14 DIAGNOSIS — R197 Diarrhea, unspecified: Secondary | ICD-10-CM | POA: Diagnosis present

## 2015-12-14 DIAGNOSIS — A09 Infectious gastroenteritis and colitis, unspecified: Secondary | ICD-10-CM

## 2015-12-14 LAB — COMPREHENSIVE METABOLIC PANEL
ALT: 49 U/L (ref 17–63)
AST: 115 U/L — ABNORMAL HIGH (ref 15–41)
Albumin: 2.3 g/dL — ABNORMAL LOW (ref 3.5–5.0)
Alkaline Phosphatase: 292 U/L — ABNORMAL HIGH (ref 38–126)
Anion gap: 7 (ref 5–15)
BILIRUBIN TOTAL: 1 mg/dL (ref 0.3–1.2)
BUN: 17 mg/dL (ref 6–20)
CHLORIDE: 109 mmol/L (ref 101–111)
CO2: 21 mmol/L — ABNORMAL LOW (ref 22–32)
CREATININE: 1.03 mg/dL (ref 0.61–1.24)
Calcium: 6.7 mg/dL — ABNORMAL LOW (ref 8.9–10.3)
Glucose, Bld: 223 mg/dL — ABNORMAL HIGH (ref 65–99)
Potassium: 4.2 mmol/L (ref 3.5–5.1)
Sodium: 137 mmol/L (ref 135–145)
TOTAL PROTEIN: 5.5 g/dL — AB (ref 6.5–8.1)

## 2015-12-14 LAB — GLUCOSE, CAPILLARY
GLUCOSE-CAPILLARY: 180 mg/dL — AB (ref 65–99)
GLUCOSE-CAPILLARY: 227 mg/dL — AB (ref 65–99)
GLUCOSE-CAPILLARY: 155 mg/dL — AB (ref 65–99)
Glucose-Capillary: 112 mg/dL — ABNORMAL HIGH (ref 65–99)

## 2015-12-14 LAB — GASTROINTESTINAL PANEL BY PCR, STOOL (REPLACES STOOL CULTURE)
ASTROVIRUS: NOT DETECTED
Adenovirus F40/41: NOT DETECTED
CAMPYLOBACTER SPECIES: NOT DETECTED
Cryptosporidium: NOT DETECTED
Cyclospora cayetanensis: NOT DETECTED
E. coli O157: NOT DETECTED
ENTAMOEBA HISTOLYTICA: NOT DETECTED
ENTEROAGGREGATIVE E COLI (EAEC): NOT DETECTED
ENTEROPATHOGENIC E COLI (EPEC): NOT DETECTED
ENTEROTOXIGENIC E COLI (ETEC): NOT DETECTED
GIARDIA LAMBLIA: NOT DETECTED
NOROVIRUS GI/GII: NOT DETECTED
PLESIMONAS SHIGELLOIDES: NOT DETECTED
Rotavirus A: NOT DETECTED
Salmonella species: NOT DETECTED
Sapovirus (I, II, IV, and V): NOT DETECTED
Shiga like toxin producing E coli (STEC): NOT DETECTED
Shigella/Enteroinvasive E coli (EIEC): NOT DETECTED
VIBRIO CHOLERAE: NOT DETECTED
Vibrio species: NOT DETECTED
Yersinia enterocolitica: NOT DETECTED

## 2015-12-14 LAB — MAGNESIUM: MAGNESIUM: 1.4 mg/dL — AB (ref 1.7–2.4)

## 2015-12-14 LAB — CBC
HCT: 24.8 % — ABNORMAL LOW (ref 39.0–52.0)
Hemoglobin: 8.2 g/dL — ABNORMAL LOW (ref 13.0–17.0)
MCH: 28.5 pg (ref 26.0–34.0)
MCHC: 33.1 g/dL (ref 30.0–36.0)
MCV: 86.1 fL (ref 78.0–100.0)
PLATELETS: 169 10*3/uL (ref 150–400)
RBC: 2.88 MIL/uL — AB (ref 4.22–5.81)
RDW: 16.6 % — AB (ref 11.5–15.5)
WBC: 19.6 10*3/uL — AB (ref 4.0–10.5)

## 2015-12-14 LAB — CANCER ANTIGEN 19-9: CA 19 9: 106 U/mL — AB (ref 0–35)

## 2015-12-14 MED ORDER — FUROSEMIDE 10 MG/ML IJ SOLN
40.0000 mg | Freq: Once | INTRAMUSCULAR | Status: AC
  Administered 2015-12-14: 40 mg via INTRAVENOUS
  Filled 2015-12-14: qty 4

## 2015-12-14 MED ORDER — INSULIN GLARGINE 100 UNIT/ML ~~LOC~~ SOLN
10.0000 [IU] | Freq: Every day | SUBCUTANEOUS | Status: DC
  Administered 2015-12-14 – 2015-12-17 (×4): 10 [IU] via SUBCUTANEOUS
  Filled 2015-12-14 (×5): qty 0.1

## 2015-12-14 MED ORDER — CARVEDILOL 12.5 MG PO TABS
12.5000 mg | ORAL_TABLET | Freq: Two times a day (BID) | ORAL | Status: DC
  Administered 2015-12-14 – 2015-12-18 (×8): 12.5 mg via ORAL
  Filled 2015-12-14 (×8): qty 1

## 2015-12-14 NOTE — Clinical Social Work Note (Signed)
Clinical Social Work Assessment  Patient Details  Name: [name] [name] MRN: [id number] Date of Birth: April 16, 1929  Date of referral:                  Reason for consult:  Facility Placement                Permission sought to share information with:  Chartered certified accountant granted to share information::  Yes, Verbal Permission Granted  Name::        Agency::     Relationship::     Contact Information:     Housing/Transportation Living arrangements for the past 2 months:  Single Family Home Source of Information:  Patient, Adult Children Patient Interpreter Needed:  None Criminal Activity/Legal Involvement Pertinent to Current Situation/Hospitalization:  No - Comment as needed Significant Relationships:  Adult Children Lives with:  Adult Children Do you feel safe going back to the place where you live?  No Need for family participation in patient care:  Yes (Comment)  Care giving concerns:  CSW received consult from [name] [name] that patient is requesting SNF placement.    Social Worker assessment / plan:  CSW spoke with patient & daughter, [name] [name] at bedside to confirm that patient is open to SNF placement. Patient states that he was living with his daughter, [name] [name] but is still on the fence about going to a facility but is open to the idea. Patient agreed to have information sent out for SNF bed offers.   Employment status:  Retired Nurse, adult PT Recommendations:  [place] / Referral to community resources:  [place]  Patient/Family's Response to care:  Patient informed CSW that his wife had been to [facility] before, but his daughter, [name] [name] requested [facility] as it is close to their church [facility]).   Patient/Family's Understanding of and Emotional Response to Diagnosis, Current Treatment, and Prognosis:  Patient requested that CSW come back  tomorrow after he works with PT again to see if he has progressed or not.   Emotional Assessment Appearance:  Appears stated age Attitude/Demeanor/Rapport:    Affect (typically observed):    Orientation:  Oriented to Self, Oriented to Place, Oriented to  Time, Oriented to Situation Alcohol / Substance use:    Psych involvement (Current and /or in the community):     Discharge Needs  Concerns to be addressed:    Readmission within the last 30 days:    Current discharge risk:    Barriers to Discharge:      [name] Brooking, LCSW 12/14/2015, 3:50 PM

## 2015-12-14 NOTE — Clinical Social Work Placement (Signed)
   CLINICAL SOCIAL WORK PLACEMENT  NOTE  Date:  12/14/2015  Patient Details  Name: [name] [name] MRN: [id number] Date of Birth: August 15, 1928  Clinical Social Work is seeking post-discharge placement for this patient at the [place] level of care (*CSW will initial, date and re-position this form in  chart as items are completed):  Yes   Patient/family provided with [place] Work Department's list of facilities offering this level of care within the geographic area requested by the patient (or if unable, by the patient's family).  Yes   Patient/family informed of their freedom to choose among providers that offer the needed level of care, that participate in Medicare, Medicaid or managed care program needed by the patient, have an available bed and are willing to accept the patient.  Yes   Patient/family informed of [facility]'s ownership interest in [facility] and [facility], as well as of the fact that they are under no obligation to receive care at these facilities.  PASRR submitted to EDS on 12/14/15     PASRR number received on 12/14/15     Existing PASRR number confirmed on       FL2 transmitted to all facilities in geographic area requested by pt/family on 12/14/15     FL2 transmitted to all facilities within larger geographic area on       Patient informed that his/her managed care company has contracts with or will negotiate with certain facilities, including the following:            Patient/family informed of bed offers received.  Patient chooses bed at       Physician recommends and patient chooses bed at      Patient to be transferred to   on  .  Patient to be transferred to facility by       Patient family notified on   of transfer.  Name of family member notified:        PHYSICIAN       Additional Comment:    _______________________________________________ Standley Brooking, LCSW 12/14/2015, 3:53 PM

## 2015-12-14 NOTE — NC FL2 (Signed)
[place] LEVEL OF CARE SCREENING TOOL     IDENTIFICATION  Patient Name: [name] [name] Birthdate: 1929/04/26 Sex: male Admission Date (Current Location): 12/11/2015  [facility] and [place] Number:  Herbalist and Address:  [facility],  [place] [address] [place]      Provider Number: [id number]  Attending Physician Name and Address:  Kelvin Cellar, MD  Relative Name and Phone Number:       Current Level of Care: Hospital Recommended Level of Care: [place] Prior Approval Number:    Date Approved/Denied:   PASRR Number: [id number]  Discharge Plan: Home    Current Diagnoses: Patient Active Problem List   Diagnosis Date Noted  . Malnutrition of moderate degree 12/12/2015  . Dehydration 12/11/2015  . Hypertension, essential 12/01/2015  . Non-insulin dependent type 2 diabetes mellitus ([place]) 12/01/2015  . Hyperlipidemia 12/01/2015  . Gout of big toe 12/01/2015  . Normocytic anemia 12/01/2015  . AKI (acute kidney injury) ([place]) 12/01/2015  . Hypokalemia 12/01/2015  . Hypocalcemia 12/01/2015  . Hypomagnesemia 12/01/2015  . Occult GI bleeding 12/01/2015  . Generalized weakness 12/01/2015  . Lower GI bleed 12/01/2015  . Symptomatic anemia   . Gastrointestinal hemorrhage with melena     Orientation RESPIRATION BLADDER Height & Weight     Self, Time, Situation, Place  Normal Incontinent, External catheter Weight: 200 lb 13.4 oz (91.1 kg) Height:  5\' 10"  (177.8 cm)  BEHAVIORAL SYMPTOMS/MOOD NEUROLOGICAL BOWEL NUTRITION STATUS      Continent Diet (NPO currently - see D/C Summary for diet upon discharge)  AMBULATORY STATUS COMMUNICATION OF NEEDS Skin   Extensive Assist Verbally Normal                       Personal Care Assistance Level of Assistance  Bathing, Dressing Bathing Assistance: Limited assistance   Dressing Assistance: Limited assistance     Functional Limitations Info              SPECIAL CARE FACTORS FREQUENCY  PT (By licensed PT), OT (By licensed OT)     PT Frequency: 5 OT Frequency: 5            Contractures      Additional Factors Info  Code Status, Allergies, Insulin Sliding Scale Code Status Info: Fullcode Allergies Info: Allergies:  Ace Inhibitors, Capoten, Glucophage, Vasotec           Current Medications (12/14/2015):  This is the current hospital active medication list Current Facility-Administered Medications  Medication Dose Route Frequency Provider Last Rate Last Dose  . antiseptic oral rinse (CPC / CETYLPYRIDINIUM CHLORIDE 0.05%) solution 7 mL  7 mL Mouth Rinse q12n4p Kinnie Feil, MD   7 mL at 12/14/15 1200  . benzonatate (TESSALON) capsule 100 mg  100 mg Oral TID PRN Kelvin Cellar, MD   100 mg at 12/14/15 0559  . calcium-vitamin D (OSCAL WITH D) 500-200 MG-UNIT per tablet 1 tablet  1 tablet Oral Q breakfast Florencia Reasons, MD   1 tablet at 12/14/15 0802  . carvedilol (COREG) tablet 6.25 mg  6.25 mg Oral BID WC Kelvin Cellar, MD   6.25 mg at 12/14/15 0802  . chlorhexidine (PERIDEX) 0.12 % solution 15 mL  15 mL Mouth Rinse BID Kinnie Feil, MD   15 mL at 12/14/15 0918  . diatrizoate meglumine-sodium (GASTROGRAFIN) 66-10 % solution 15 mL  15 mL Oral PRN Florencia Reasons, MD   15 mL  at 12/12/15 1230  . feeding supplement (ENSURE ENLIVE) (ENSURE ENLIVE) liquid 237 mL  237 mL Oral BID BM Florencia Reasons, MD   237 mL at 12/14/15 1000  . hydrALAZINE (APRESOLINE) injection 5 mg  5 mg Intravenous Q6H PRN Kelvin Cellar, MD      . insulin aspart (novoLOG) injection 0-9 Units  0-9 Units Subcutaneous TID WC Kinnie Feil, MD   3 Units at 12/14/15 1228  . latanoprost (XALATAN) 0.005 % ophthalmic solution 1 drop  1 drop Both Eyes QHS Kinnie Feil, MD   1 drop at 12/13/15 2033  . magnesium oxide (MAG-OX) tablet 400 mg  400 mg Oral BID Kinnie Feil, MD   400 mg at 12/14/15 0918  . pantoprazole (PROTONIX) EC tablet 40 mg  40 mg Oral Daily Kinnie Feil, MD   40 mg at 12/14/15 0918  . sodium chloride flush (NS) 0.9 % injection 3 mL  3 mL Intravenous Q12H Kinnie Feil, MD   3 mL at 12/14/15 1000  . terazosin (HYTRIN) capsule 5 mg  5 mg Oral QHS Kinnie Feil, MD   5 mg at 12/13/15 2033  . vancomycin (VANCOCIN) 50 mg/mL oral solution 125 mg  125 mg Oral Q6H Rhetta Mura Schorr, NP   125 mg at 12/14/15 1228     Discharge Medications: Please see discharge summary for a list of discharge medications.  Relevant Imaging Results:  Relevant Lab Results:   Additional Information SSN: [id number]  Standley Brooking, LCSW

## 2015-12-14 NOTE — Evaluation (Signed)
Clinical/Bedside Swallow Evaluation Patient Details  Name: [name] [name] MRN: [id number] Date of Birth: 10/08/1928  Today's Date: 12/14/2015 Time: SLP Start Time (ACUTE ONLY): [id number] SLP Stop Time (ACUTE ONLY): 1602 SLP Time Calculation (min) (ACUTE ONLY): 17 min  Past Medical History:  Past Medical History  Diagnosis Date  . Hypertension   . Diabetes mellitus without complication ([place])   . Gout   . Hyperlipidemia    Past Surgical History:  Past Surgical History  Procedure Laterality Date  . Esophagogastroduodenoscopy N/A 12/03/2015    Procedure: ESOPHAGOGASTRODUODENOSCOPY (EGD);  Surgeon: Milus Banister, MD;  Location: [facility];  Service: Endoscopy;  Laterality: N/A;  . Colonoscopy N/A 12/04/2015    Procedure: COLONOSCOPY;  Surgeon: Milus Banister, MD;  Location: [facility];  Service: Endoscopy;  Laterality: N/A;   HPI:  82 y.o. male with medical history significant for hypertension, type 2 diabetes mellitus, hyperlipidemia, gout, and pan-diverticulosis with lower GI bleed in 2006 who now presents to the ED with 2 weeks of melena and loss of appetite, and 2-3 days of generalized weakness and fatigue. Patient reports pain in his usual state of health until approximately 2 weeks, he noticed that his stools had become very dark. Around that same time, he developed a loss of appetite, reporting that food no longer has much of the taste. His condition persisted largely unchanged until 2-3 days ago when he developed an acute gout flare in the left first MTP as well as generalized weakness and fatigue. He was evaluated for the gout flare at an urgent care and treated effectively with colchicine. Patient denies any abdominal pain, nausea, vomiting, or diarrhea. Aside from the recent course of colchicine, he denies any new medications or changes in his medications. He takes a daily aspirin 81 mg, but no anticoagulation. He denies any headaches, focal numbness or weakness, or loss of  coordination. Of note, the patient was admitted for a diverticular bleed in 2006, found to have pan-diverticulosis on colonoscopy, and a surgical consultant felt that he may at some point require colectomy. There is a history of a 12 cm tubulous adenoma removed endoscopically in 2006 with no atypia on pathology report; pt with recent EGD and colonoscopy while in hospital; family/staff concerned with aspiration; pt c/o occasional coughing with larger volumes of liquids and "food sticking" in his throat.   Assessment / Plan / Recommendation Clinical Impression   Pt consumed thin via various volumes, puree and solids without overt s/s of aspiration present during BSE; pt stated he recently had difficulty with liquids via straw causing coughing, but this wasn't observed during BSE this date; recommend Regular diet/thin via cup; no straws with ST f/u 1-2x for diet tolerance to assess fatigue factor with po intake.     Aspiration Risk  Mild aspiration risk    Diet Recommendation   Regular/thin via cup sips; no straws  Medication Administration: Whole meds with puree    Other  Recommendations Oral Care Recommendations: Oral care BID   Follow up Recommendations  None    Frequency and Duration min 2x/week  1 week       Prognosis Prognosis for Safe Diet Advancement: Good      Swallow Study   General Date of Onset: 12/01/15 HPI: 82 y.o. male with medical history significant for hypertension, type 2 diabetes mellitus, hyperlipidemia, gout, and pan-diverticulosis with lower GI bleed in 2006 who now presents to the ED with 2 weeks of melena and loss of appetite, and 2-3 days  of generalized weakness and fatigue. Patient reports pain in his usual state of health until approximately 2 weeks, he noticed that his stools had become very dark. Around that same time, he developed a loss of appetite, reporting that food no longer has much of the taste. His condition persisted largely unchanged until 2-3 days  ago when he developed an acute gout flare in the left first MTP as well as generalized weakness and fatigue. He was evaluated for the gout flare at an urgent care and treated effectively with colchicine. Patient denies any abdominal pain, nausea, vomiting, or diarrhea. Aside from the recent course of colchicine, he denies any new medications or changes in his medications. He takes a daily aspirin 81 mg, but no anticoagulation. He denies any headaches, focal numbness or weakness, or loss of coordination. Of note, the patient was admitted for a diverticular bleed in 2006, found to have pan-diverticulosis on colonoscopy, and a surgical consultant felt that he may at some point require colectomy. There is a history of a 12 cm tubulous adenoma removed endoscopically in 2006 with no atypia on pathology report Type of Study: Bedside Swallow Evaluation Previous Swallow Assessment: n/a Diet Prior to this Study: NPO Temperature Spikes Noted: No Respiratory Status: Nasal cannula History of Recent Intubation: No Behavior/Cognition: Alert;Cooperative;Pleasant mood Oral Cavity Assessment: Within Functional Limits Oral Care Completed by SLP: Recent completion by staff Oral Cavity - Dentition: Poor condition;Missing dentition Vision: Functional for self-feeding Self-Feeding Abilities: Able to feed self Patient Positioning: Upright in bed Baseline Vocal Quality: Hoarse Volitional Cough: Strong Volitional Swallow: Able to elicit    Oral/Motor/Sensory Function Overall Oral Motor/Sensory Function: Within functional limits   Ice Chips Ice chips: Within functional limits Presentation: Spoon   Thin Liquid Thin Liquid: Within functional limits Presentation: Cup;Spoon;Straw Other Comments:  (Pt states intermittent difficulty with liquids)    Nectar Thick Nectar Thick Liquid: Not tested   Honey Thick Honey Thick Liquid: Not tested   Puree Puree: Within functional limits Presentation: Spoon   Solid      Solid:  Within functional limits Presentation: Self Fed Other Comments:  (Pt c/o "food sticking" in throat intermittently; had EGD)        ADAMS,PAT, M.S., CCC-SLP 12/14/2015,4:25 PM

## 2015-12-14 NOTE — Progress Notes (Signed)
PROGRESS NOTE  [name] [name] VZD:[id number] DOB: 1929/03/06 DOA: 12/11/2015 PCP: No PCP Per Patient   Assessment/Plan: Active Problems:   Hypertension, essential   Non-insulin dependent type 2 diabetes mellitus (HCC)   AKI (acute kidney injury) ([place])   Dehydration   Malnutrition of moderate degree   C. difficile colitis   Diarrhea   AKI, dehydration, hypomagnesemia, hypocalcemia.  -Poor oral intake.  -Failure to thrive likely secondary to malignancy with recent imaging revealing evidence of possible metastatic pancreatic cancer. -Physical therapy consulted -Creatinine normalizing, will encourage by mouth intake, stop IV fluids due to concern for volume overload.   Suspected metastatic cancer.  bili-1.5, alk phos-285, AST-121. abd exam: benign. No abdominal pain,. Lipase wnl,   liver US : 1. Multiple indeterminate hypoechoic liver masses scattered throughout the liver measuring up to 6.4 cm in the right liver lobe, worrisome for liver metastases.  -CT scan of abdomen and pelvis performed on 12/12/2015 revealing widespread hepatic metastatic disease, with radiology reporting pancreatic body mass and that there is suspicion for findings to represent metastatic pancreatic cancer. -He underwent ultrasound-guided biopsy of liver lesion on 12/13/2015, tolerated procedure well.  Volume Overload -Overnight patient reporting increased shortness of breath, found to have increased work of breathing. He was placed on supplemental oxygen. On exam he had distended neck veins with lower extremity edema. -He was given 40 mg of IV Lasix 1 on 12/14/2015 will monitor response. -Transthoracic echocardiogram performed 12/13/2015 showing preserved ejection fraction 55% with grade 1 diastolic dysfunction  C. difficile colitis. -Patient reporting loose stools since undergoing colonic prep on recent hospitalization last week. Stool for C. difficile was checked yesterday evening coming back positive for  C. difficile toxin. -Labs show an upward trend in his white count to 19,600 from 13,700 -He was placed on contact precautions and started on oral vancomycin.  Noninsulin dependent DM2.  -Blood sugars fluctuating between 171 and 227. -Cover with Lantus 10 units subcutaneous daily  Left leg edema. L>R. [place] no DVT.    History of acute blood loss anemia:  -iron deficiency: Recent hospitalization in 11/2015 for GI bleeding (5/26-5/29). EGD: non bleeding angiodysplastic lesion s/p coagulation. (APM) -no s/s of acute bleeding, cont monitor, PPI  HTN -His blood pressures remain elevated, will increase Coreg from 6.25 mg to 12.5 mg by mouth twice a day  Code Status: Full code  Family Communication: Spoke with daughter present at bedside  Disposition Plan: Physical therapy recommending skilled nursing facility placement   Consultants:  Interventional radiology  Procedures:  Ultrasound-guided biopsy of liver lesion on 12/13/2015  Antibiotics:  none   Objective: BP 134/85 mmHg  Pulse 84  Temp(Src) 98 F (36.7 C) (Oral)  Resp 22  Ht '5\' 10"'$  (1.778 m)  Wt 91.1 kg (200 lb 13.4 oz)  BMI 28.82 kg/m2  SpO2 100%  Intake/Output Summary (Last 24 hours) at 12/14/15 1553 Last data filed at 12/14/15 0919  Gross per 24 hour  Intake     63 ml  Output    300 ml  Net   -237 ml   Filed Weights   12/11/15 1252  Weight: 91.1 kg (200 lb 13.4 oz)    Exam:   General:  Frail, Chronically ill-appearing, appears dyspneic at rest  Cardiovascular: RRR, has positive jugular venous distention, left greater than right extremity edema  Respiratory: Fine crackles to bases bilaterally  Abdomen: Soft/ND/NT, positive BS  Musculoskeletal: left leg edema  Neuro:aaox3, flat affect, mild psychomotor retardation ( daughter reported patient's wife  died two years ago and a grandchild died last year, patient has not been motivated to do much for the last two years), patient denies depression.    Data Reviewed: Basic Metabolic Panel:  Recent Labs Lab 12/11/15 0816 12/12/15 0449 12/13/15 0502 12/14/15 0948  NA 140 140 138 137  K 3.8 3.9 3.8 4.2  CL 107 109 108 109  CO2 21* 21* 20* 21*  GLUCOSE 154* 141* 145* 223*  BUN 30* 23* 17 17  CREATININE 1.82* 1.37* 0.97 1.03  CALCIUM 6.1* 6.1* 6.4* 6.7*  MG 0.9* 1.5* 1.5* 1.4*   Liver Function Tests:  Recent Labs Lab 12/11/15 0816 12/12/15 0449 12/13/15 0502 12/14/15 0948  AST 121* 120* 123* 115*  ALT 47 45 47 49  ALKPHOS 285* 253* 296* 292*  BILITOT 1.5* 1.6* 1.7* 1.0  PROT 5.8* 5.4* 5.5* 5.5*  ALBUMIN 2.5* 2.3* 2.3* 2.3*    Recent Labs Lab 12/11/15 1352  LIPASE 29   No results for input(s): AMMONIA in the last 168 hours. CBC:  Recent Labs Lab 12/11/15 0816 12/12/15 0449 12/13/15 0502 12/14/15 0948  WBC 12.5* 12.2* 13.7* 19.6*  HGB 8.7* 8.0* 8.3* 8.2*  HCT 25.9* 23.8* 25.1* 24.8*  MCV 86.6 87.2 88.1 86.1  PLT 191 185 182 169   Cardiac Enzymes:    Recent Labs Lab 12/11/15 1352  CKTOTAL 200   BNP (last 3 results) No results for input(s): BNP in the last 8760 hours.  ProBNP (last 3 results) No results for input(s): PROBNP in the last 8760 hours.  CBG:  Recent Labs Lab 12/13/15 1217 12/13/15 1700 12/13/15 2206 12/14/15 0801 12/14/15 1108  GLUCAP 150* 216* 171* 180* 227*    Recent Results (from the past 240 hour(s))  C difficile quick scan w PCR reflex     Status: Abnormal   Collection Time: 12/13/15  6:54 PM  Result Value Ref Range Status   C Diff antigen POSITIVE (A) NEGATIVE Final   C Diff toxin POSITIVE (A) NEGATIVE Final   C Diff interpretation Positive for toxigenic C. difficile  Final    Comment: CRITICAL RESULT CALLED TO, READ BACK BY AND VERIFIED WITH: Elwin Sleight RN 6.7.17 @ 2139 BY RICEJ   Gastrointestinal Panel by PCR , Stool     Status: None   Collection Time: 12/13/15  6:54 PM  Result Value Ref Range Status   Campylobacter species NOT DETECTED NOT DETECTED Final    Plesimonas shigelloides NOT DETECTED NOT DETECTED Final   Salmonella species NOT DETECTED NOT DETECTED Final   Yersinia enterocolitica NOT DETECTED NOT DETECTED Final   Vibrio species NOT DETECTED NOT DETECTED Final   Vibrio cholerae NOT DETECTED NOT DETECTED Final   Enteroaggregative E coli (EAEC) NOT DETECTED NOT DETECTED Final   Enteropathogenic E coli (EPEC) NOT DETECTED NOT DETECTED Final   Enterotoxigenic E coli (ETEC) NOT DETECTED NOT DETECTED Final   Shiga like toxin producing E coli (STEC) NOT DETECTED NOT DETECTED Final   E. coli O157 NOT DETECTED NOT DETECTED Final   Shigella/Enteroinvasive E coli (EIEC) NOT DETECTED NOT DETECTED Final   Cryptosporidium NOT DETECTED NOT DETECTED Final   Cyclospora cayetanensis NOT DETECTED NOT DETECTED Final   Entamoeba histolytica NOT DETECTED NOT DETECTED Final   Giardia lamblia NOT DETECTED NOT DETECTED Final   Adenovirus F40/41 NOT DETECTED NOT DETECTED Final   Astrovirus NOT DETECTED NOT DETECTED Final   Norovirus GI/GII NOT DETECTED NOT DETECTED Final   Rotavirus A NOT DETECTED NOT DETECTED Final  Sapovirus (I, II, IV, and V) NOT DETECTED NOT DETECTED Final     Studies: Dg Chest 1 View  12/14/2015  CLINICAL DATA:  Hypertension.  Diabetes EXAM: CHEST 1 VIEW COMPARISON:  12/11/2015 FINDINGS: Normal heart size. Decreased lung volumes. No pleural effusion or edema. No airspace consolidation. IMPRESSION: 1. Decreased lung volumes. 2. No acute findings. Electronically Signed   By: Kerby Moors M.D.   On: 12/14/2015 12:13    Scheduled Meds: . antiseptic oral rinse  7 mL Mouth Rinse q12n4p  . calcium-vitamin D  1 tablet Oral Q breakfast  . carvedilol  12.5 mg Oral BID WC  . chlorhexidine  15 mL Mouth Rinse BID  . feeding supplement (ENSURE ENLIVE)  237 mL Oral BID BM  . insulin aspart  0-9 Units Subcutaneous TID WC  . insulin glargine  10 Units Subcutaneous QHS  . latanoprost  1 drop Both Eyes QHS  . magnesium oxide  400 mg Oral BID   . pantoprazole  40 mg Oral Daily  . sodium chloride flush  3 mL Intravenous Q12H  . terazosin  5 mg Oral QHS  . vancomycin  125 mg Oral Q6H    Continuous Infusions:     Time spent: 30 mins  Kelvin Cellar MD  [facility] Pager [phone]. If 7PM-7AM, please contact night-coverage at [email or web address], password [facility] 12/14/2015, 3:53 PM  LOS: 1 day

## 2015-12-14 NOTE — Progress Notes (Signed)
Pts family was saying pt wasn't breathing right, pt had 02 sat of 98% on RA, given 2L [place] for comfort, 02 sat 100%. Family was requesting a chest xray, paged K. Schorr, since pt was stable no chest xray was ordered.  Rosine Beat, RN

## 2015-12-15 ENCOUNTER — Inpatient Hospital Stay (HOSPITAL_COMMUNITY): Payer: Medicare Other

## 2015-12-15 DIAGNOSIS — R0602 Shortness of breath: Secondary | ICD-10-CM

## 2015-12-15 DIAGNOSIS — I10 Essential (primary) hypertension: Secondary | ICD-10-CM

## 2015-12-15 DIAGNOSIS — R197 Diarrhea, unspecified: Secondary | ICD-10-CM

## 2015-12-15 DIAGNOSIS — C787 Secondary malignant neoplasm of liver and intrahepatic bile duct: Secondary | ICD-10-CM

## 2015-12-15 DIAGNOSIS — D649 Anemia, unspecified: Secondary | ICD-10-CM

## 2015-12-15 DIAGNOSIS — C250 Malignant neoplasm of head of pancreas: Secondary | ICD-10-CM

## 2015-12-15 DIAGNOSIS — A047 Enterocolitis due to Clostridium difficile: Secondary | ICD-10-CM

## 2015-12-15 DIAGNOSIS — C251 Malignant neoplasm of body of pancreas: Secondary | ICD-10-CM

## 2015-12-15 DIAGNOSIS — R627 Adult failure to thrive: Secondary | ICD-10-CM

## 2015-12-15 DIAGNOSIS — C259 Malignant neoplasm of pancreas, unspecified: Secondary | ICD-10-CM | POA: Insufficient documentation

## 2015-12-15 LAB — BASIC METABOLIC PANEL
ANION GAP: 8 (ref 5–15)
BUN: 16 mg/dL (ref 6–20)
CALCIUM: 6.8 mg/dL — AB (ref 8.9–10.3)
CHLORIDE: 109 mmol/L (ref 101–111)
CO2: 22 mmol/L (ref 22–32)
Creatinine, Ser: 0.98 mg/dL (ref 0.61–1.24)
GFR calc non Af Amer: >60 mL/min (ref >60)
GLUCOSE: 129 mg/dL — AB (ref 65–99)
POTASSIUM: 4.3 mmol/L (ref 3.5–5.1)
Sodium: 139 mmol/L (ref 135–145)

## 2015-12-15 LAB — GLUCOSE, CAPILLARY
GLUCOSE-CAPILLARY: 210 mg/dL — AB (ref 65–99)
GLUCOSE-CAPILLARY: 229 mg/dL — AB (ref 65–99)
Glucose-Capillary: 128 mg/dL — ABNORMAL HIGH (ref 65–99)
Glucose-Capillary: 178 mg/dL — ABNORMAL HIGH (ref 65–99)

## 2015-12-15 LAB — CBC
HEMATOCRIT: 24.5 % — AB (ref 39.0–52.0)
Hemoglobin: 8.1 g/dL — ABNORMAL LOW (ref 13.0–17.0)
MCH: 29.1 pg (ref 26.0–34.0)
MCHC: 33.1 g/dL (ref 30.0–36.0)
MCV: 88.1 fL (ref 78.0–100.0)
Platelets: 166 10*3/uL (ref 150–400)
RBC: 2.78 MIL/uL — ABNORMAL LOW (ref 4.22–5.81)
RDW: 16.9 % — AB (ref 11.5–15.5)
WBC: 16.2 10*3/uL — AB (ref 4.0–10.5)

## 2015-12-15 LAB — CEA: CEA: 5823 ng/mL — AB (ref 0.0–4.7)

## 2015-12-15 LAB — PREPARE RBC (CROSSMATCH)

## 2015-12-15 LAB — AFP TUMOR MARKER: AFP-Tumor Marker: 3.2 ng/mL (ref 0.0–8.3)

## 2015-12-15 MED ORDER — SODIUM CHLORIDE 0.9 % IV SOLN: Freq: Once | INTRAVENOUS | Status: DC

## 2015-12-15 NOTE — Progress Notes (Signed)
Speech Language Pathology Treatment: Dysphagia  Patient Details Name: RAJEEV ESCUE MRN: [id number] DOB: 20-Jun-1929 Today's Date: 12/15/2015 Time: 4503-8882 SLP Time Calculation (min) (ACUTE ONLY): 29 min  Assessment / Plan / Recommendation Clinical Impression  Pt observed with minimal intake *cracker and water via cup.  Timely swallow with clear voice throughout and no indication of aspiration.  Pt admits to becoming "strangled" on liquids easily but can not indicate specific timing of occurences.      SlP reviewed prior UGI from 2005 with pt and this SLP suspects his strangling and globus symptoms are due to known distal esophageal narrowing, esophageal dysmotility and reflux.  He did state he has undergone esophageal dilatation without improvement in symptoms.  Given premorbid diagnosis, SLP provided him with compensation strategies verbally and in writing.    Multiple prior CXRs were clear upon chart review, therefore suspect tolerance of his dysphagia.  Poor appetite reported by pt - diet advanced today by MD to carb mod/thin.    All education completed, will sign off.  Thanks for allowing me to help with this pt's care plan.    HPI HPI: 82 y.o. male with medical history significant for hypertension, type 2 diabetes mellitus, hyperlipidemia, gout, and pan-diverticulosis with lower GI bleed in 2006 who now presents to the ED with 2 weeks of melena and loss of appetite, and 2-3 days of generalized weakness and fatigue. Patient reports pain in his usual state of health until approximately 2 weeks, he noticed that his stools had become very dark. Around that same time, he developed a loss of appetite, reporting that food no longer has much of the taste. His condition persisted largely unchanged until 2-3 days ago when he developed an acute gout flare in the left first MTP as well as generalized weakness and fatigue. He was evaluated for the gout flare at an urgent care and treated effectively with  colchicine. Patient denies any abdominal pain, nausea, vomiting, or diarrhea. Aside from the recent course of colchicine, he denies any new medications or changes in his medications. He takes a daily aspirin 81 mg, but no anticoagulation. He denies any headaches, focal numbness or weakness, or loss of coordination. Of note, the patient was admitted for a diverticular bleed in 2006, found to have pan-diverticulosis on colonoscopy, and a surgical consultant felt that he may at some point require colectomy. There is a history of a 12 cm tubulous adenoma removed endoscopically in 2006 with no atypia on pathology report.  Pt underwent BSE yesterday and today is seen to determine tolerance of po diet and for ecucation.  Upon review of chart, UGI study from 2005 showed distal narrowing, esophageal dysmotility, GERD and sliding hiatal hernia. Pt admits to "strangling" on liquids at times and sensing food lodging in throat.        SLP Plan  All goals met     Recommendations  Diet recommendations: Regular;Thin liquid Liquids provided via: Cup;No straw Medication Administration: Whole meds with liquid Supervision: Patient able to self feed Compensations: Slow rate;Small sips/bites (consume liquids t/o meal)             Oral Care Recommendations: Oral care BID Follow up Recommendations: None Plan: All goals met     [place], [place] [facility] SLP [phone]  12/15/2015, 12:01 PM

## 2015-12-15 NOTE — Progress Notes (Signed)
Report received from G.Sison,RN. No change in assessment. Continue plan of care. Stacey Drain

## 2015-12-15 NOTE — Consult Note (Signed)
[name] [name] Kitchen    HEMATOLOGY/ONCOLOGY CONSULTATION NOTE  Date of Service: 12/15/2015  Patient Care Team: No Pcp Per Patient as PCP - General (General Practice)   Attending: .Kelvin Cellar, MD   CHIEF COMPLAINTS/PURPOSE OF CONSULTATION:  Newly diagnosed metastatic adenocarcinoma.  HISTORY OF PRESENTING ILLNESS:   [name] [name] is a wonderful 82 y.o. male who has been referred to [place] by Dr Coralyn Pear for evaluation and management of newly diagnosed metastatic adenocarcinoma.  His history of hypertension, diabetes, gout and was recently admitted from 12/01/2015-12/04/2015 due to generalized weakness poor oral intake and possible GI bleeding.  Patient had a GI workup with EGD and colonoscopy. Colonoscopy showed cecal polyp which was noted to be a tubular adenoma with no high-grade dysplasia or malignancy. EGD showed no evidence of acute bleeding. Presence of nonbleeding angiodysplasia which was treated with APC. Patient received PRBC transfusions and his hemoglobin on discharge was 9.4. He also received IV fluids in the setting of acute kidney injury .  Patient was discharged home but unfortunately would be admitted on 12/11/2015 with failure to thrive. He was also noted to be dehydrated, hypomagnesemia, hypocalcemic and acute renal failure. He was having diarrhea after his colonoscopy had a stool study for C. difficile which was positive. He is currently on oral vancomycin. Patient was noted to have some liver function abnormalities with a bilirubin of 1.5 and elevated alkaline phosphatase and AST and had an ultrasound of the abdomen which showed multiple indeterminate hypoechoic liver masses measuring up to 6.4 cm in the right liver lobe concerning for liver metastases. Patient subsequently had a CT of the abdomen and pelvis on 12/12/2015 which revealed widespread hepatic metastatic disease as well as periportal and upper abdominal lymphadenopathy and a 2.6 cm pancreatic body mass. Overall findings were  suggestive of metastatic pancreatic cancer.  Tumor markers were sent which showed elevation of the CA-19-9, significant elevation of CEA and normal AFP tumor marker. Colonoscopy had shown no evidence of colon cancer.  Patient had an ultrasound-guided biopsy of his liver lesion the final pathology of which is pending at this time. Preliminary results as per pathology is consistent with adenocarcinoma.   I was consulted today to discuss the current situation with the patient and his family.  Patient notes decreased oral intake and poor appetite. Notes that his diarrhea from his last and so is improved. One of his daughters is at bedside and involved discussion. All the findings thus far were explained in detail including the concern for likely metastatic pancreatic cancer. We initiated discussion of goals of care. Patient's daughter suggested she would like to talk to her other sisters and would like to rediscuss the situation and the final biopsy results are available to help decide the definitive plan of care.   Patient reports that all of his deterioration of health has been over the last few weeks to months. He lives with one of his daughters and prior to his recent failure to thrive was able to ambulate with a cane and was still driving his own car. He understands that he is gotten much weaker. He reports that he has also been more short of breath and has been using oxygen. Also has developed bilateral lower extremity swelling right more than left without significant calf pain and tenderness.  MEDICAL HISTORY:  Past Medical History  Diagnosis Date  . Hypertension   . Diabetes mellitus without complication ([place])   . Gout   . Hyperlipidemia     SURGICAL HISTORY: Past Surgical  History  Procedure Laterality Date  . Esophagogastroduodenoscopy N/A 12/03/2015    Procedure: ESOPHAGOGASTRODUODENOSCOPY (EGD);  Surgeon: Milus Banister, MD;  Location: [facility];  Service: Endoscopy;  Laterality:  N/A;  . Colonoscopy N/A 12/04/2015    Procedure: COLONOSCOPY;  Surgeon: Milus Banister, MD;  Location: [facility];  Service: Endoscopy;  Laterality: N/A;    SOCIAL HISTORY: Social History   Social History  . Marital Status: Married    Spouse Name: N/A  . Number of Children: N/A  . Years of Education: N/A   Occupational History  . Not on file.   Social History Main Topics  . Smoking status: Former Research scientist (life sciences)  . Smokeless tobacco: Never Used  . Alcohol Use: No  . Drug Use: No  . Sexual Activity: No   Other Topics Concern  . Not on file   Social History Narrative    FAMILY HISTORY: History reviewed. No pertinent family history.  ALLERGIES:  is allergic to ace inhibitors; capoten; glucophage; and vasotec.  MEDICATIONS:  Current Facility-Administered Medications  Medication Dose Route Frequency Provider Last Rate Last Dose  . antiseptic oral rinse (CPC / CETYLPYRIDINIUM CHLORIDE 0.05%) solution 7 mL  7 mL Mouth Rinse q12n4p Kinnie Feil, MD   7 mL at 12/15/15 1200  . benzonatate (TESSALON) capsule 100 mg  100 mg Oral TID PRN Kelvin Cellar, MD   100 mg at 12/15/15 0540  . calcium-vitamin D (OSCAL WITH D) 500-200 MG-UNIT per tablet 1 tablet  1 tablet Oral Q breakfast Florencia Reasons, MD   1 tablet at 12/15/15 0819  . carvedilol (COREG) tablet 12.5 mg  12.5 mg Oral BID WC Kelvin Cellar, MD   12.5 mg at 12/15/15 0819  . chlorhexidine (PERIDEX) 0.12 % solution 15 mL  15 mL Mouth Rinse BID Kinnie Feil, MD   15 mL at 12/15/15 0921  . diatrizoate meglumine-sodium (GASTROGRAFIN) 66-10 % solution 15 mL  15 mL Oral PRN Florencia Reasons, MD   15 mL at 12/12/15 1230  . feeding supplement (ENSURE ENLIVE) (ENSURE ENLIVE) liquid 237 mL  237 mL Oral BID BM Florencia Reasons, MD   237 mL at 12/15/15 0921  . hydrALAZINE (APRESOLINE) injection 5 mg  5 mg Intravenous Q6H PRN Kelvin Cellar, MD      . insulin aspart (novoLOG) injection 0-9 Units  0-9 Units Subcutaneous TID WC Kinnie Feil, MD   2 Units at  12/15/15 1211  . insulin glargine (LANTUS) injection 10 Units  10 Units Subcutaneous QHS Kelvin Cellar, MD   10 Units at 12/14/15 2200  . latanoprost (XALATAN) 0.005 % ophthalmic solution 1 drop  1 drop Both Eyes QHS Kinnie Feil, MD   1 drop at 12/14/15 2041  . magnesium oxide (MAG-OX) tablet 400 mg  400 mg Oral BID Kinnie Feil, MD   400 mg at 12/15/15 0921  . pantoprazole (PROTONIX) EC tablet 40 mg  40 mg Oral Daily Kinnie Feil, MD   40 mg at 12/15/15 0921  . sodium chloride flush (NS) 0.9 % injection 3 mL  3 mL Intravenous Q12H Kinnie Feil, MD   3 mL at 12/15/15 1000  . terazosin (HYTRIN) capsule 5 mg  5 mg Oral QHS Kinnie Feil, MD   5 mg at 12/14/15 2041  . vancomycin (VANCOCIN) 50 mg/mL oral solution 125 mg  125 mg Oral Q6H Rhetta Mura Schorr, NP   125 mg at 12/15/15 1211    REVIEW OF SYSTEMS:  10 Point review of Systems was done is negative except as noted above.  PHYSICAL EXAMINATION: ECOG PERFORMANCE STATUS: 2-3  . Filed Vitals:   12/14/15 2022 12/15/15 0520  BP: 137/105 136/76  Pulse: 92 85  Temp: 98.2 F (36.8 C) 98 F (36.7 C)  Resp: 20 18   Filed Weights   12/11/15 1252  Weight: 200 lb 13.4 oz (91.1 kg)   .Body mass index is 28.82 kg/(m^2).  GENERAL:Elderly gentleman in bed eating lunch in no acute distress. SKIN: No acute rashes  EYES: Sclerae anicteric OROPHARYNX:no exudate, no erythema and lips, buccal mucosa, and tongue normal  NECK: supple, no JVD, thyroid normal size, non-tender, without nodularity LYMPH:  no palpable lymphadenopathy in the cervical, axillary or inguinal LUNGS: clear to auscultation with normal respiratory effort HEART: regular rate & rhythm ABDOMEN: abdomen obese , soft, non-tender, normoactive bowel sounds  Musculoskeletal: Bilateral 3+ pedal edema, pitting, left more than right PSYCH: alert & oriented x 3 NEURO: Moving all 4 extremities  LABORATORY DATA:  I have reviewed the data as listed  . CBC  Latest Ref Rng 12/15/2015 12/14/2015 12/13/2015  WBC 4.0 - 10.5 K/uL 16.2(H) 19.6(H) 13.7(H)  Hemoglobin 13.0 - 17.0 g/dL 8.1(L) 8.2(L) 8.3(L)  Hematocrit 39.0 - 52.0 % 24.5(L) 24.8(L) 25.1(L)  Platelets 150 - 400 K/uL 166 169 182   . CBC    Component Value Date/Time   WBC 16.2* 12/15/2015 0416   WBC 12.3* 11/30/2015 1457   RBC 2.78* 12/15/2015 0416   RBC 2.71* 12/01/2015 2339   RBC 3.03* 11/30/2015 1457   HGB 8.1* 12/15/2015 0416   HGB 9.1* 11/30/2015 1457   HCT 24.5* 12/15/2015 0416   HCT 24.1* 12/01/2015 2339   HCT 26.4* 11/30/2015 1457   PLT 166 12/15/2015 0416   MCV 88.1 12/15/2015 0416   MCV 87.1 11/30/2015 1457   MCH 29.1 12/15/2015 0416   MCH 29.9 11/30/2015 1457   MCHC 33.1 12/15/2015 0416   MCHC 34.4 11/30/2015 1457   RDW 16.9* 12/15/2015 0416   LYMPHSABS 1.0 12/01/2015 2056   MONOABS 0.9 12/01/2015 2056   EOSABS 0.0 12/01/2015 2056   BASOSABS 0.0 12/01/2015 2056     . CMP Latest Ref Rng 12/15/2015 12/14/2015 12/13/2015  Glucose 65 - 99 mg/dL 129(H) 223(H) 145(H)  BUN 6 - 20 mg/dL 16 17 17   Creatinine 0.61 - 1.24 mg/dL 0.98 1.03 0.97  Sodium 135 - 145 mmol/L 139 137 138  Potassium 3.5 - 5.1 mmol/L 4.3 4.2 3.8  Chloride 101 - 111 mmol/L 109 109 108  CO2 22 - 32 mmol/L 22 21(L) 20(L)  Calcium 8.9 - 10.3 mg/dL 6.8(L) 6.7(L) 6.4(LL)  Total Protein 6.5 - 8.1 g/dL - 5.5(L) 5.5(L)  Total Bilirubin 0.3 - 1.2 mg/dL - 1.0 1.7(H)  Alkaline Phos 38 - 126 U/L - 292(H) 296(H)  AST 15 - 41 U/L - 115(H) 123(H)  ALT 17 - 63 U/L - 49 47   Component     Latest Ref Rng 12/13/2015  Hepatitis B Surface Ag     Negative Negative  HCV Ab     0.0 - 0.9 s/co ratio 0.1  Hep A Ab, IgM     Negative Negative  Hep B Core Ab, IgM     Negative Negative  TSH     0.350 - 4.500 uIU/mL 4.363  HIV     Non Reactive Non Reactive    Component     Latest Ref Rng 12/13/2015  AFP Tumor Marker  0.0 - 8.3 ng/mL 3.2  CEA     0.0 - 4.7 ng/mL 5823.0 (H)  PSA     0.00 - 4.00 ng/mL 8.31 (H)  CA  19-9     0 - 35 U/mL 106 (H)       Colonoscopy 12/04/2015  - One 17 mm polyp in the cecum, removed with a hot snare. Resected and retrieved. This may habor advanced neoplasia given slightly irregular appearance at its tip. - Diverticulosis in the left colon. - The examination was otherwise normal on direct and retroflexion views.  EGD 11/25/2015 - Normal esophagus. - Gastritis. Biopsied. - One non-bleeding angiodysplastic lesion in the stomach. Treated with argon plasma coagulation (APC). - Normal examined duodenum.    RADIOGRAPHIC STUDIES: I have personally reviewed the radiological images as listed and agreed with the findings in the report. Ct Abdomen Pelvis Wo Contrast  12/12/2015  CLINICAL DATA:  82 year old male with abnormal LFTs. Multiple liver masses detected on recent ultrasound. Initial encounter. EXAM: CT ABDOMEN AND PELVIS WITHOUT CONTRAST TECHNIQUE: Multidetector CT imaging of the abdomen and pelvis was performed following the standard protocol without IV contrast. COMPARISON:  Abdomen ultrasound 12/12/2015. FINDINGS: Small bilateral layering pleural effusions. Right lung base atelectasis. No suspicious lung base nodule or mass identified. Bulky endplate degenerative spurring in the lower thoracic spine. Advanced lower lumbar disc and facet degeneration. No acute or suspicious osseous lesion identified. Mild nonspecific presacral stranding. The prostate appears enlarged and heterogeneous (note heterogeneity at the superior aspect of the prostate on series 2, image 79). The gland encompasses 6-7 cm diameter. The urinary bladder is largely decompressed. There is a large 4.4 cm oval lamellated bladder stone on the right (series 2, image 77). There are superimposed small calcified pelvic phleboliths. There is mild rectal wall thickening. The rectum is mildly distended with low-density stool. The distal sigmoid is decompressed. The proximal sigmoid is redundant and mildly gas  distended. There is moderate diverticulosis at the junction of the sigmoid and descending colon segments. There is a small volume of free fluid in the left gutter. The left colon otherwise appears negative. Mild motion artifact at the transverse colon which appears negative aside from retained stool. Mild to moderate diverticulosis of the right colon. Negative appendix. Oral contrast has almost reached the terminal ileum. No dilated small bowel. Oral contrast distends the stomach which otherwise appears normal. No definite duodenal abnormality. Small volume of free fluid in the right gutter and at the inferior right liver tip, greater than the volume of fluid on the left. Enlarged and heterogeneous liver containing numerous hypodense masses, which coalesce 2 lesions up to 8 cm diameter. All liver lobes appear affected. Small volume perihepatic fluid anteriorly and at the dome. The gallbladder appears contracted with wall thickening and pericholecystic fluid, but no significant inflammatory stranding emanating from the gallbladder fossa. Noncontrast spleen is within normal limits. There is evidence of a 2.5 cm hypodense mass at the body of the pancreas as seen on series 2, image 36. No pancreatic ductal dilatation is evident. The head and uncinate process have a more normal appearance, although there is associated peripancreatic lymphadenopathy including up to 2.8 cm abnormal nodes or ex nodal disease at the portal caval station (series 2, image 35). Mild thickening of the adrenal glands which otherwise have a negative noncontrast appearance. Nonspecific mild to moderate perinephric stranding. There is a a oval mildly lobulated 16 mm kidney stone at the left lower pole with no associated left hydronephrosis. No definite  hydroureter. Tortuous distal abdominal aorta with calcified atherosclerosis. Tortuous proximal iliac arteries. Vascular patency not evaluated in the absence of IV contrast. IMPRESSION: 1. Noncontrast  study demonstrating evidence of widespread hepatic metastatic disease. Associated porta hepatis and upper abdominal lymphadenopathy. Small volume abdominal free fluid. Small bilateral pleural effusions. 2. Evidence of a 2.6 cm hypodense pancreatic body mass, such that metastatic pancreatic tumor is the favored unifying diagnosis at this time. 3. However, the above findings would be much better characterized on an IV contrast-enhanced study. In the inpatient setting Pancreas protocol CT of the Abdomen with IV contrast may be most appropriate. 4. Enlarged and heterogeneous prostate also noted, such that the main differential consideration may be metastatic prostate cancer. However, there is no evidence of metastatic disease to the skeleton. 5. Incidentally noted large bladder calculus and left lower pole nonobstructing renal calculus. 6. Calcified aortic atherosclerosis. Electronically Signed   By: Genevie Ann M.D.   On: 12/12/2015 15:00   Dg Chest 1 View  12/14/2015  CLINICAL DATA:  Hypertension.  Diabetes EXAM: CHEST 1 VIEW COMPARISON:  12/11/2015 FINDINGS: Normal heart size. Decreased lung volumes. No pleural effusion or edema. No airspace consolidation. IMPRESSION: 1. Decreased lung volumes. 2. No acute findings. Electronically Signed   By: Kerby Moors M.D.   On: 12/14/2015 12:13   Dg Chest 2 View  12/11/2015  CLINICAL DATA:  82 year old male with recent increase in weakness. Cough, loss of appetite. EXAM: CHEST  2 VIEW COMPARISON:  Chest x-ray 12/01/2015. FINDINGS: Mild elevation of the right hemidiaphragm (chronic and unchanged). Lung volumes are normal. No consolidative airspace disease. No pleural effusions. No pneumothorax. No pulmonary nodule or mass noted. Pulmonary vasculature and the cardiomediastinal silhouette are within normal limits. Atherosclerosis in the thoracic aorta. IMPRESSION: 1. No radiographic evidence of acute cardiopulmonary disease. Electronically Signed   By: Vinnie Langton M.D.    On: 12/11/2015 08:39   Dg Chest 2 View  12/01/2015  CLINICAL DATA:  Acute onset of bilateral leg weakness. Initial encounter. EXAM: CHEST  2 VIEW COMPARISON:  Chest radiograph performed 09/12/2013 FINDINGS: The lungs are mildly hypoexpanded. There is mild elevation of the right hemidiaphragm. There is no evidence of focal opacification, pleural effusion or pneumothorax. The heart is borderline normal in size. No acute osseous abnormalities are seen. IMPRESSION: Lungs mildly hypoexpanded but grossly clear, with mild elevation of the right hemidiaphragm. Electronically Signed   By: Garald Balding M.D.   On: 12/01/2015 21:31   Ct Head Wo Contrast  12/01/2015  CLINICAL DATA:  Weakness.  Dizziness for several days. EXAM: CT HEAD WITHOUT CONTRAST TECHNIQUE: Contiguous axial images were obtained from the base of the skull through the vertex without intravenous contrast. COMPARISON:  Head CT 09/12/2013 FINDINGS: No intracranial hemorrhage, mass effect, or midline shift. The degree of atrophy and chronic small vessel ischemia is unchanged from prior exam. No hydrocephalus. The basilar cisterns are patent. No evidence of territorial infarct. No intracranial fluid collection. Atherosclerosis of skullbase vasculature. Calvarium is intact. Included paranasal sinuses and mastoid air cells are well aerated. Improved mucosal thickening of the ethmoid air cells and maxillary sinuses. IMPRESSION: 1.  No acute intracranial abnormality. 2. Atrophy and chronic small vessel ischemia, unchanged from prior exam. Electronically Signed   By: Jeb Levering M.D.   On: 12/01/2015 21:40   US Renal  12/12/2015  CLINICAL DATA:  Increased BUN and creatinine, edema EXAM: RENAL / URINARY TRACT ULTRASOUND COMPLETE COMPARISON:  None. FINDINGS: Right Kidney: Length: 11 cm. Echogenicity  within normal limits. No mass or hydronephrosis visualized. Left Kidney: Length: 11.4 cm. Normal echogenicity with no hydronephrosis. 2 x 2 x 2.2 cm hypoechoic  mass interpolar region left kidney. 15 mm echogenic focus with shadowing in the lower pole suggesting stone. Bladder: 4.2 cm echogenic focus within the bladder causing posterior acoustic shadowing possibly representing a large calculus. Prostate is enlarged at 65 x 64 x 67 mm. IMPRESSION: 1. Indeterminate left renal mass versus cyst. If the patient cannot undergo contrast-enhanced CT scan consider renal protocol MRI. 2. Nonobstructing 15 mm left renal stone 3. 4 cm bladder calculus.  Enlargement of the prostate. Electronically Signed   By: Skipper Cliche M.D.   On: 12/12/2015 08:57   US Biopsy  12/13/2015  INDICATION: Liver lesions EXAM: ULTRASOUND-GUIDED BIOPSY OF A LIVER LESION.  CORE. MEDICATIONS: None. ANESTHESIA/SEDATION: Fentanyl 25 mcg IV; Versed 1 mg IV Moderate Sedation Time:  14 The patient was continuously monitored during the procedure by the interventional radiology nurse under my direct supervision. FLUOROSCOPY TIME:  Fluoroscopy Time:  minutes  seconds ( mGy). COMPLICATIONS: None immediate. PROCEDURE: Informed written consent was obtained from the patient after a thorough discussion of the procedural risks, benefits and alternatives. All questions were addressed. Maximal Sterile Barrier Technique was utilized including caps, mask, sterile gowns, sterile gloves, sterile drape, hand hygiene and skin antiseptic. A timeout was performed prior to the initiation of the procedure. The right flank was prepped with ChloraPrep in a sterile fashion, and a sterile drape was applied covering the operative field. A sterile gown and sterile gloves were used for the procedure. Under sonographic guidance, an 59 gauge guide needle was advanced into the right lobe of the liver, within a lesion. Subsequently 2 18 gauge core biopsies were obtained. Gel-Foam slurry was injected into the tract. The guide needle was removed. Final imaging was performed. Patient tolerated the procedure well without complication. Vital sign  monitoring by nursing staff during the procedure will continue as patient is in the special procedures unit for post procedure observation. FINDINGS: The images document guide needle placement within the right lobe liver lesion. Post biopsy images demonstrate no hemorrhage. IMPRESSION: Successful ultrasound-guided core biopsy of a right lobe liver lesion. Electronically Signed   By: Marybelle Killings M.D.   On: 12/13/2015 13:09   Dg Foot 2 Views Left  11/30/2015  CLINICAL DATA:  Swollen, painful left great toe EXAM: LEFT FOOT - 2 VIEW COMPARISON:  None. FINDINGS: Tarsal-metatarsal alignment is normal. No fracture is seen. No definite erosion is noted. Joint spaces are relatively well preserved for age. There is faint opacity within the soft tissues adjacent to the left first metatarsal head which could represent faint calcification possibly indicative of gout. There is pes planus present. There are degenerative changes in the midfoot with degenerative calcaneal spurs also noted. IMPRESSION: 1. No acute abnormality. 2. Questionable faint calcification in the soft tissues adjacent to the left first MTP joint could indicate gout but no erosions are noted. 3. Pes planus with degenerative changes in the mid and hindfoot. Electronically Signed   By: Ivar Drape M.D.   On: 11/30/2015 15:12   US Abdomen Limited Ruq  12/12/2015  CLINICAL DATA:  82 year old male inpatient with elevated liver function tests. EXAM: US ABDOMEN LIMITED - RIGHT UPPER QUADRANT COMPARISON:  None. FINDINGS: Gallbladder: Contracted gallbladder, limiting gallbladder evaluation. No definite gallbladder wall thickening accounting for the contracted state. No gallstones, pericholecystic fluid or sonographic Murphy sign. Common bile duct: Diameter: 2 mm Liver:  There are multiple (greater than 5) hypoechoic liver masses scattered throughout the liver measuring up to 6.3 x 5.7 x 6.4 cm in the right liver lobe. The background liver parenchymal echotexture and  echogenicity are normal. Trace perihepatic ascites. IMPRESSION: 1. Multiple indeterminate hypoechoic liver masses scattered throughout the liver measuring up to 6.4 cm in the right liver lobe, worrisome for liver metastases. Further evaluation with CT abdomen/pelvis with IV and oral contrast is recommended. 2. Trace perihepatic ascites. 3. Contracted gallbladder with no gallstones. Electronically Signed   By: Ilona Sorrel M.D.   On: 12/12/2015 09:00    ASSESSMENT & PLAN:   82 year old male with  #1 Newly diagnosed metastatic adenocarcinoma with pancreatic body mass and multiple liver metastases as well as metastasis to the upper abdominal and periportal lymph nodes. Overall picture consistent with metastatic pancreatic adenocarcinoma.  Final pathology results pending Elevation of CA-19-9 and CEA levels. Patient with currently poor performance status with ECOG PS of 2-3 Plan -Patient currently not having much pain. -I initiated the discussion of possible diagnosis, prognosis and goals of care with the patient and one of his daughters. -Awaiting final pathology results -If this is confirmed to be metastatic pancreatic cancer we'll need to define further goals of care with the patient to determine if he would want to choose comfort cares with hospice vs palliative chemotherapy. -In the unlikely event that this is a more responsive primary such as prostate cancer would need to consider other treatment possibilities (this is unlikely)  #2 shortness of breath with new requirement of oxygen - rule out pulmonary embolism especially since the patient also has bilateral lower extremity swelling. Rule out pulmonary metastases. Ultrasound extremities apparently negative for DVT. Plan -If negative might need to consider VQ scan of the chest  to avoid contrast exposure in the setting of recent AKI -Will need CT of the chest to complete staging and check for pulmonary metastases. -Continue oxygen  #3 C.  difficile colitis with diarrhea -Complete course of oral vancomycin  #4 anemia likely anemia of chronic disease related to his metastatic malignancy. -Transfuse when necessary to maintain hemoglobin more than 9.  #5 . Failure to thrive with dehydration multiple electrolyte abnormalities poor appetite. Plan -IV fluids and electrolyte replacement. -Dietary input for nutritional optimization.  We'll follow-up with the patient on Monday if still in the hospital and final pathology results are available for further discussion. If the patient is discharged over the weekend will see the patient in clinic in 1 week.  All of the patients questions were answered with apparent satisfaction. The patient knows to call the clinic with any problems, questions or concerns.  I spent 70 minutes counseling the patient face to face. The total time spent in the appointment was 80 minutes and more than 50% was on counseling and direct patient cares.    Sullivan Lone MD [place] AAHIVMS [facility] Hematology/Oncology Physician [facility]  (Office):       [phone] (Work cell):  [phone] (Fax):           [phone]  12/15/2015 12:45 PM

## 2015-12-15 NOTE — Progress Notes (Signed)
PROGRESS NOTE  [name] [name] [id number] DOB: [birth year] DOA: 12/11/2015 PCP: No PCP Per Patient   Assessment/Plan: Active Problems:   Hypertension, essential   Non-insulin dependent type 2 diabetes mellitus (HCC)   AKI (acute kidney injury) ([place])   Dehydration   Malnutrition of moderate degree   C. difficile colitis   Diarrhea   Suspected metastatic pancreatic cancer.  -CT scan of abdomen and pelvis performed on 12/12/2015 revealing widespread hepatic metastatic disease, with radiology reporting pancreatic body mass and that there is suspicion for findings to represent metastatic pancreatic cancer. -He underwent ultrasound-guided biopsy of liver lesion on 12/13/2015, tolerated procedure well. -On 12/15/2015 I spoke with pathology reporting adenocarcinoma. Radiology had previously reported that metastatic pancreatic cancer was the favored unified diagnosis.  -I spoke with family members on 12/15/2015 regarding biopsy findings.  -Dr Irene Limbo of Medical Oncology consulted, recommended checking a CA 19-9 as well as AFP. Patient recently underwent GI workup on previous hospitalization that included EGD and colonoscopy, negative for malignancy.   Volume Overload -Overnight patient reporting increased shortness of breath, found to have increased work of breathing. He was placed on supplemental oxygen. On exam he had distended neck veins with lower extremity edema. -He was given 40 mg of IV Lasix 1 on 12/14/2015  -Transthoracic echocardiogram performed 12/13/2015 showing preserved ejection fraction 55% with grade 1 diastolic dysfunction -On [id number] patient reporting significant improvement to his breathing.  C. difficile colitis. -Patient reporting loose stools since undergoing colonic prep on recent hospitalization last week. Stool for C. difficile was checked yesterday evening coming back positive for C. difficile toxin. -He was placed on contact precautions and started on oral  vancomycin. -AM lab work showing a downward trend in his white count from 19,600-16,200. He states feeling better with improvement to his diarrhea  Noninsulin dependent DM2.  -Blood sugars improved, continue Lantus 10 units subcutaneous daily  Left leg edema. L>R. [place] no DVT.    History of acute blood loss anemia:  -iron deficiency: Recent hospitalization in 11/2015 for GI bleeding (5/26-5/29). EGD: non bleeding angiodysplastic lesion s/p coagulation. (APM) -no s/s of acute bleeding, cont monitor, PPI  HTN -His blood pressures remain elevated, increased Coreg from 6.25 mg to 12.5 mg by mouth twice a day on 12/14/2015 -Patient having subsequent improvement to his blood pressures  Code Status: Full code  Family Communication: I spoke with his daughters, updated on patient's condition  Disposition Plan: Physical therapy recommending skilled nursing facility placement   Consultants:  Interventional radiology  Procedures:  Ultrasound-guided biopsy of liver lesion on 12/13/2015  Antibiotics:  none   Objective: BP 136/76 mmHg  Pulse 85  Temp(Src) 98 F (36.7 C) (Oral)  Resp 18  Ht 5\' 10"  (1.778 m)  Wt 91.1 kg (200 lb 13.4 oz)  BMI 28.82 kg/m2  SpO2 100%  Intake/Output Summary (Last 24 hours) at 12/15/15 1329 Last data filed at 12/15/15 [id number]  Gross per 24 hour  Intake    243 ml  Output   1200 ml  Net   -957 ml   Filed Weights   12/11/15 1252  Weight: 91.1 kg (200 lb 13.4 oz)    Exam:   General:  Frail, Chronically ill-appearing,Looks better today.  Cardiovascular: RRR, has positive jugular venous distention, left greater than right extremity edema  Respiratory: Fine crackles to bases bilaterally  Abdomen: Soft/ND/NT, positive BS  Musculoskeletal: left leg edema  Neuro:aaox3, flat affect, mild psychomotor retardation ( daughter reported patient's wife died two years ago  and a grandchild died last year, patient has not been motivated to do much for the  last two years), patient denies depression.   Data Reviewed: Basic Metabolic Panel:  Recent Labs Lab 12/11/15 0816 12/12/15 0449 12/13/15 0502 12/14/15 0948 12/15/15 0416  NA 140 140 138 137 139  K 3.8 3.9 3.8 4.2 4.3  CL 107 109 108 109 109  CO2 21* 21* 20* 21* 22  GLUCOSE 154* 141* 145* 223* 129*  BUN 30* 23* 17 17 16   CREATININE 1.82* 1.37* 0.97 1.03 0.98  CALCIUM 6.1* 6.1* 6.4* 6.7* 6.8*  MG 0.9* 1.5* 1.5* 1.4*  --    Liver Function Tests:  Recent Labs Lab 12/11/15 0816 12/12/15 0449 12/13/15 0502 12/14/15 0948  AST 121* 120* 123* 115*  ALT 47 45 47 49  ALKPHOS 285* 253* 296* 292*  BILITOT 1.5* 1.6* 1.7* 1.0  PROT 5.8* 5.4* 5.5* 5.5*  ALBUMIN 2.5* 2.3* 2.3* 2.3*    Recent Labs Lab 12/11/15 1352  LIPASE 29   No results for input(s): AMMONIA in the last 168 hours. CBC:  Recent Labs Lab 12/11/15 0816 12/12/15 0449 12/13/15 0502 12/14/15 0948 12/15/15 0416  WBC 12.5* 12.2* 13.7* 19.6* 16.2*  HGB 8.7* 8.0* 8.3* 8.2* 8.1*  HCT 25.9* 23.8* 25.1* 24.8* 24.5*  MCV 86.6 87.2 88.1 86.1 88.1  PLT 191 185 182 169 166   Cardiac Enzymes:    Recent Labs Lab 12/11/15 1352  CKTOTAL 200   BNP (last 3 results) No results for input(s): BNP in the last 8760 hours.  ProBNP (last 3 results) No results for input(s): PROBNP in the last 8760 hours.  CBG:  Recent Labs Lab 12/14/15 1108 12/14/15 1629 12/14/15 2106 12/15/15 0755 12/15/15 1141  GLUCAP 227* 155* 112* 128* 178*    Recent Results (from the past 240 hour(s))  C difficile quick scan w PCR reflex     Status: Abnormal   Collection Time: 12/13/15  6:54 PM  Result Value Ref Range Status   C Diff antigen POSITIVE (A) NEGATIVE Final   C Diff toxin POSITIVE (A) NEGATIVE Final   C Diff interpretation Positive for toxigenic C. difficile  Final    Comment: CRITICAL RESULT CALLED TO, READ BACK BY AND VERIFIED WITH: Elwin Sleight RN 6.7.17 @ 2139 BY RICEJ   Gastrointestinal Panel by PCR , Stool      Status: None   Collection Time: 12/13/15  6:54 PM  Result Value Ref Range Status   Campylobacter species NOT DETECTED NOT DETECTED Final   Plesimonas shigelloides NOT DETECTED NOT DETECTED Final   Salmonella species NOT DETECTED NOT DETECTED Final   Yersinia enterocolitica NOT DETECTED NOT DETECTED Final   Vibrio species NOT DETECTED NOT DETECTED Final   Vibrio cholerae NOT DETECTED NOT DETECTED Final   Enteroaggregative E coli (EAEC) NOT DETECTED NOT DETECTED Final   Enteropathogenic E coli (EPEC) NOT DETECTED NOT DETECTED Final   Enterotoxigenic E coli (ETEC) NOT DETECTED NOT DETECTED Final   Shiga like toxin producing E coli (STEC) NOT DETECTED NOT DETECTED Final   E. coli O157 NOT DETECTED NOT DETECTED Final   Shigella/Enteroinvasive E coli (EIEC) NOT DETECTED NOT DETECTED Final   Cryptosporidium NOT DETECTED NOT DETECTED Final   Cyclospora cayetanensis NOT DETECTED NOT DETECTED Final   Entamoeba histolytica NOT DETECTED NOT DETECTED Final   Giardia lamblia NOT DETECTED NOT DETECTED Final   Adenovirus F40/41 NOT DETECTED NOT DETECTED Final   Astrovirus NOT DETECTED NOT DETECTED Final   Norovirus  GI/GII NOT DETECTED NOT DETECTED Final   Rotavirus A NOT DETECTED NOT DETECTED Final   Sapovirus (I, II, IV, and V) NOT DETECTED NOT DETECTED Final     Studies: No results found.  Scheduled Meds: . antiseptic oral rinse  7 mL Mouth Rinse q12n4p  . calcium-vitamin D  1 tablet Oral Q breakfast  . carvedilol  12.5 mg Oral BID WC  . chlorhexidine  15 mL Mouth Rinse BID  . feeding supplement (ENSURE ENLIVE)  237 mL Oral BID BM  . insulin aspart  0-9 Units Subcutaneous TID WC  . insulin glargine  10 Units Subcutaneous QHS  . latanoprost  1 drop Both Eyes QHS  . magnesium oxide  400 mg Oral BID  . pantoprazole  40 mg Oral Daily  . sodium chloride flush  3 mL Intravenous Q12H  . terazosin  5 mg Oral QHS  . vancomycin  125 mg Oral Q6H    Continuous Infusions:     Time spent: 30  mins  Kelvin Cellar MD  [facility] Pager [phone]. If 7PM-7AM, please contact night-coverage at [email or web address], password [facility] 12/15/2015, 1:29 PM  LOS: 2 days

## 2015-12-15 NOTE — Progress Notes (Signed)
Physical Therapy Treatment Patient Details Name: [name] [name] MRN: [id number] DOB: Feb 11, 1929 Today's Date: 12/15/2015    History of Present Illness 82 y.o. male with PMH of HTN, DM, Gout, Recent hospitalization for GI bleeding (5/26-5/29) presented with generalized weakness, diagnosed with AKI, dehydration, hypomagnesemia, hypocalcemia and liver masses.    PT Comments    Assisted pt OOB to amb a limited distance.  Pt will need ST Rehab at SNF  Follow Up Recommendations  SNF     Equipment Recommendations       Recommendations for Other Services       Precautions / Restrictions Precautions Precautions: Fall Precaution Comments: monitor sats    Mobility  Bed Mobility Overal bed mobility: Needs Assistance Bed Mobility: Supine to Sit     Supine to sit: Mod assist     General bed mobility comments: assist for trunk upright and LEs off bed  Transfers Overall transfer level: Needs assistance Equipment used: Rolling walker (2 wheeled) Transfers: Sit to/from Stand Sit to Stand: +2 physical assistance;Mod assist         General transfer comment: + 2 Mod Assist to power up with 50% VC's on proper hand placement and safety with stand to sit as pt tends to "plop".   Ambulation/Gait Ambulation/Gait assistance: Min assist;+2 safety/equipment Ambulation Distance (Feet): 48 Feet Assistive device: Rolling walker (2 wheeled) Gait Pattern/deviations: Step-to pattern;Step-through pattern;Trunk flexed;Shuffle Gait velocity: decr   General Gait Details: limited activity tolerance and unsteady gait.  Pt required + 2 assist for safety such that recliner was following.    Stairs            Wheelchair Mobility    Modified Rankin (Stroke Patients Only)       Balance                                    Cognition Arousal/Alertness: Awake/alert Behavior During Therapy: WFL for tasks assessed/performed Overall Cognitive Status: Within Functional Limits  for tasks assessed                      Exercises      General Comments        Pertinent Vitals/Pain Pain Assessment: No/denies pain    Home Living                      Prior Function            PT Goals (current goals can now be found in the care plan section) Progress towards PT goals: Progressing toward goals    Frequency  Min 3X/week    PT Plan      Co-evaluation             End of Session Equipment Utilized During Treatment: Gait belt Activity Tolerance: Patient tolerated treatment well Patient left: in chair;with call bell/phone within reach;with family/visitor present     Time: [id number] PT Time Calculation (min) (ACUTE ONLY): 27 min  Charges:  $Gait Training: 8-22 mins $Therapeutic Activity: 8-22 mins                    G Codes:      Rica Koyanagi  PTA [facility]  Acute  Rehab Pager      [phone]

## 2015-12-15 NOTE — Clinical Social Work Placement (Signed)
Patient has a bed at [facility]. CSW has completed FL2 & will continue to follow and assist with discharge when ready.   *Will need PT to work with patient again Sunday or Monday to submit updated clinicals for [facility] authorization.    Raynaldo Opitz, [facility] Clinical Social Worker cell #: [phone]     CLINICAL SOCIAL WORK PLACEMENT  NOTE  Date:  12/15/2015  Patient Details  Name: [name] [name] MRN: [id number] Date of Birth: 1928/12/23  Clinical Social Work is seeking post-discharge placement for this patient at the [place] level of care (*CSW will initial, date and re-position this form in  chart as items are completed):  Yes   Patient/family provided with [place] Work Department's list of facilities offering this level of care within the geographic area requested by the patient (or if unable, by the patient's family).  Yes   Patient/family informed of their freedom to choose among providers that offer the needed level of care, that participate in Medicare, Medicaid or managed care program needed by the patient, have an available bed and are willing to accept the patient.  Yes   Patient/family informed of [facility]'s ownership interest in [facility] and [facility], as well as of the fact that they are under no obligation to receive care at these facilities.  PASRR submitted to EDS on 12/14/15     PASRR number received on 12/14/15     Existing PASRR number confirmed on       FL2 transmitted to all facilities in geographic area requested by pt/family on 12/14/15     FL2 transmitted to all facilities within larger geographic area on       Patient informed that his/her managed care company has contracts with or will negotiate with certain facilities, including the following:        Yes   Patient/family informed of bed offers received.  Patient chooses bed at [facility]      Physician recommends and patient chooses bed at      Patient to be transferred to [facility] on  .  Patient to be transferred to facility by       Patient family notified on   of transfer.  Name of family member notified:        PHYSICIAN       Additional Comment:    _______________________________________________ Standley Brooking, LCSW 12/15/2015, 2:12 PM

## 2015-12-15 NOTE — Progress Notes (Signed)
Blood on hold due to nuclear med study.

## 2015-12-16 ENCOUNTER — Inpatient Hospital Stay (HOSPITAL_COMMUNITY): Payer: Medicare Other

## 2015-12-16 DIAGNOSIS — Z7189 Other specified counseling: Secondary | ICD-10-CM

## 2015-12-16 LAB — BASIC METABOLIC PANEL
ANION GAP: 9 (ref 5–15)
BUN: 22 mg/dL — ABNORMAL HIGH (ref 6–20)
CALCIUM: 7.1 mg/dL — AB (ref 8.9–10.3)
CO2: 22 mmol/L (ref 22–32)
CREATININE: 1.09 mg/dL (ref 0.61–1.24)
Chloride: 106 mmol/L (ref 101–111)
GFR calc non Af Amer: >60 mL/min (ref >60)
Glucose, Bld: 245 mg/dL — ABNORMAL HIGH (ref 65–99)
Potassium: 4.5 mmol/L (ref 3.5–5.1)
SODIUM: 137 mmol/L (ref 135–145)

## 2015-12-16 LAB — GLUCOSE, CAPILLARY
GLUCOSE-CAPILLARY: 215 mg/dL — AB (ref 65–99)
GLUCOSE-CAPILLARY: 193 mg/dL — AB (ref 65–99)
Glucose-Capillary: 195 mg/dL — ABNORMAL HIGH (ref 65–99)
Glucose-Capillary: 209 mg/dL — ABNORMAL HIGH (ref 65–99)

## 2015-12-16 LAB — CBC
HCT: 26.8 % — ABNORMAL LOW (ref 39.0–52.0)
HEMOGLOBIN: 9 g/dL — AB (ref 13.0–17.0)
MCH: 29.7 pg (ref 26.0–34.0)
MCHC: 33.6 g/dL (ref 30.0–36.0)
MCV: 88.4 fL (ref 78.0–100.0)
PLATELETS: 181 10*3/uL (ref 150–400)
RBC: 3.03 MIL/uL — AB (ref 4.22–5.81)
RDW: 16.3 % — ABNORMAL HIGH (ref 11.5–15.5)
WBC: 15.5 10*3/uL — AB (ref 4.0–10.5)

## 2015-12-16 MED ORDER — TECHNETIUM TO 99M ALBUMIN AGGREGATED
4.1900 | Freq: Once | INTRAVENOUS | Status: AC | PRN
  Administered 2015-12-16: 4 via INTRAVENOUS

## 2015-12-16 MED ORDER — TECHNETIUM TC 99M DIETHYLENETRIAME-PENTAACETIC ACID: 32.6000 | Freq: Once | INTRAVENOUS | Status: DC | PRN

## 2015-12-16 NOTE — Progress Notes (Signed)
PROGRESS NOTE  [name] [name] [id number] DOB: 10-09-1928 DOA: 12/11/2015 PCP: No PCP Per [name] [name]   Assessment/Plan: Active Problems:   Hypertension, essential   Non-insulin dependent type 2 diabetes mellitus ([place])   AKI (acute kidney injury) ([place])   Dehydration   Malnutrition of moderate degree   C. difficile colitis   Diarrhea   Pancreatic cancer metastasized to liver [facility])   Suspected metastatic pancreatic cancer.  -CT scan of abdomen and pelvis performed on 12/12/2015 revealing widespread hepatic metastatic disease, with radiology reporting pancreatic body mass and that there is suspicion for findings to represent metastatic pancreatic cancer. -He underwent ultrasound-guided biopsy of liver lesion on 12/13/2015, tolerated procedure well. -On 12/15/2015 I spoke with pathology reporting adenocarcinoma. Radiology had previously reported that metastatic pancreatic cancer was the favored unified diagnosis.  -I spoke with family members on 12/15/2015 regarding biopsy findings.  -Dr Irene Limbo of Medical Oncology consulted, recommended checking a CA 19-9 as well as AFP. [name] [name] recently underwent GI workup on previous hospitalization that included EGD and colonoscopy, negative for malignancy.  -On 12/16/2015 had extensive discussion with [name] [name] and his daughter discussing medical goals of care. [name] [name] expresses wishes to focus his care on comfort and at this time is not interested in pursuing chemotherapy.  Volume Overload -Overnight [name] [name] reporting increased shortness of breath, found to have increased work of breathing. He was placed on supplemental oxygen. On exam he had distended neck veins with lower extremity edema. -He was given 40 mg of IV Lasix 1 on 12/14/2015  -Transthoracic echocardiogram performed 12/13/2015 showing preserved ejection fraction 55% with grade 1 diastolic dysfunction -On [id number] [name] [name] reporting significant improvement to his breathing. -VQ scan  performed on 12/16/2015 showing low probability for pulmonary embolism  C. difficile colitis. -[name] [name] reporting loose stools since undergoing colonic prep on recent hospitalization last week. Stool for C. difficile was checked yesterday evening coming back positive for C. difficile toxin. -He was placed on contact precautions and started on oral vancomycin. -Clinically shown improvement, diarrhea nearly resolving. Plan for a total of 14 days of oral vancomycin  Noninsulin dependent DM2.  -Blood sugars improved, continue Lantus 10 units subcutaneous daily  Left leg edema. L>R. [place] no DVT.    History of acute blood loss anemia:  -iron deficiency: Recent hospitalization in 11/2015 for GI bleeding (5/26-5/29). EGD: non bleeding angiodysplastic lesion s/p coagulation. (APM) -Hemoglobin trended down to 8.1 on 12/15/2015. Medical oncology recommending transfusion. On 12/15/2015 he was transfused 1 unit of packed red blood cells. Repeat labs showing hemoglobin of 9.0. Will continue to monitor.  HTN -His blood pressures remain elevated, increased Coreg from 6.25 mg to 12.5 mg by mouth twice a day on 12/14/2015 -[name] [name] having subsequent improvement to his blood pressures  Medical goals of care. -On 12/16/2015 I spent time to discuss medical goals of care with [name] [name] and his daughter who was present at bedside. We discussed the option of chemotherapy and the potential burdens associated with this intervention. [name].Schanz stated having family members who were treated with chemotherapy and witnessed them undergo the significant side effects. He thinks that he would not want to undergo this intervention and would prefer to focus his care on comfort. His daughter who is present at bedside supports his father's decision on this. He discussed the option of hospice however his daughter reports not wanting to discuss this now. She is fearful that discussion of hospice will bring her father's moral down.  -We  also talk to about  CODE STATUS. He did not wish for aerobic measures to be undertaken in the event of cardiopulmonary arrest. He states he would like to go comfortably. DO NOT RESUSCITATE order placed in chart.  Code Status: DO NOT RESUSCITATE  Family Communication: I spoke with his daughter  Disposition Plan: Anticipate discharge to skilled nursing facility possibly this coming Monday   Consultants:  Interventional radiology  Procedures:  Ultrasound-guided biopsy of liver lesion on 12/13/2015  Antibiotics:  none   Objective: BP 125/73 mmHg  Pulse 90  Temp(Src) 98.4 F (36.9 C) (Oral)  Resp 18  Ht 5\' 10"  (1.778 m)  Wt 91.1 kg (200 lb 13.4 oz)  BMI 28.82 kg/m2  SpO2 100%  Intake/Output Summary (Last 24 hours) at 12/16/15 1259 Last data filed at 12/16/15 1104  Gross per 24 hour  Intake    921 ml  Output    200 ml  Net    721 ml   Filed Weights   12/11/15 1252  Weight: 91.1 kg (200 lb 13.4 oz)    Exam:   General:  Frail, Chronically ill-appearing, Appears more interactive today.  Cardiovascular: RRR, has positive jugular venous distention, left greater than right extremity edema  Respiratory: Fine crackles to bases bilaterally  Abdomen: Soft/ND/NT, positive BS  Musculoskeletal: left leg edema  Neuro:aaox3, flat affect, mild psychomotor retardation ( daughter reported [name] [name]'s wife died two years ago and a grandchild died last year, [name] [name] has not been motivated to do much for the last two years), [name] [name] denies depression.   Data Reviewed: Basic Metabolic Panel:  Recent Labs Lab 12/11/15 0816 12/12/15 0449 12/13/15 0502 12/14/15 0948 12/15/15 0416 12/16/15 0421  NA 140 140 138 137 139 137  K 3.8 3.9 3.8 4.2 4.3 4.5  CL 107 109 108 109 109 106  CO2 21* 21* 20* 21* 22 22  GLUCOSE 154* 141* 145* 223* 129* 245*  BUN 30* 23* 17 17 16  22*  CREATININE 1.82* 1.37* 0.97 1.03 0.98 1.09  CALCIUM 6.1* 6.1* 6.4* 6.7* 6.8* 7.1*  MG 0.9* 1.5* 1.5* 1.4*   --   --    Liver Function Tests:  Recent Labs Lab 12/11/15 0816 12/12/15 0449 12/13/15 0502 12/14/15 0948  AST 121* 120* 123* 115*  ALT 47 45 47 49  ALKPHOS 285* 253* 296* 292*  BILITOT 1.5* 1.6* 1.7* 1.0  PROT 5.8* 5.4* 5.5* 5.5*  ALBUMIN 2.5* 2.3* 2.3* 2.3*    Recent Labs Lab 12/11/15 1352  LIPASE 29   No results for input(s): AMMONIA in the last 168 hours. CBC:  Recent Labs Lab 12/12/15 0449 12/13/15 0502 12/14/15 0948 12/15/15 0416 12/16/15 0421  WBC 12.2* 13.7* 19.6* 16.2* 15.5*  HGB 8.0* 8.3* 8.2* 8.1* 9.0*  HCT 23.8* 25.1* 24.8* 24.5* 26.8*  MCV 87.2 88.1 86.1 88.1 88.4  PLT 185 182 169 166 181   Cardiac Enzymes:    Recent Labs Lab 12/11/15 1352  CKTOTAL 200   BNP (last 3 results) No results for input(s): BNP in the last 8760 hours.  ProBNP (last 3 results) No results for input(s): PROBNP in the last 8760 hours.  CBG:  Recent Labs Lab 12/15/15 1141 12/15/15 1622 12/15/15 2157 12/16/15 0742 12/16/15 1156  GLUCAP 178* 210* 229* 215* 195*    Recent Results (from the past 240 hour(s))  C difficile quick scan w PCR reflex     Status: Abnormal   Collection Time: 12/13/15  6:54 PM  Result Value Ref Range Status   C Diff antigen POSITIVE (  A) NEGATIVE Final   C Diff toxin POSITIVE (A) NEGATIVE Final   C Diff interpretation Positive for toxigenic C. difficile  Final    Comment: CRITICAL RESULT CALLED TO, READ BACK BY AND VERIFIED WITH: Elwin Sleight RN 6.7.17 @ 2139 BY RICEJ   Gastrointestinal Panel by PCR , Stool     Status: None   Collection Time: 12/13/15  6:54 PM  Result Value Ref Range Status   Campylobacter species NOT DETECTED NOT DETECTED Final   Plesimonas shigelloides NOT DETECTED NOT DETECTED Final   Salmonella species NOT DETECTED NOT DETECTED Final   Yersinia enterocolitica NOT DETECTED NOT DETECTED Final   Vibrio species NOT DETECTED NOT DETECTED Final   Vibrio cholerae NOT DETECTED NOT DETECTED Final   Enteroaggregative E  coli (EAEC) NOT DETECTED NOT DETECTED Final   Enteropathogenic E coli (EPEC) NOT DETECTED NOT DETECTED Final   Enterotoxigenic E coli (ETEC) NOT DETECTED NOT DETECTED Final   Shiga like toxin producing E coli (STEC) NOT DETECTED NOT DETECTED Final   E. coli O157 NOT DETECTED NOT DETECTED Final   Shigella/Enteroinvasive E coli (EIEC) NOT DETECTED NOT DETECTED Final   Cryptosporidium NOT DETECTED NOT DETECTED Final   Cyclospora cayetanensis NOT DETECTED NOT DETECTED Final   Entamoeba histolytica NOT DETECTED NOT DETECTED Final   Giardia lamblia NOT DETECTED NOT DETECTED Final   Adenovirus F40/41 NOT DETECTED NOT DETECTED Final   Astrovirus NOT DETECTED NOT DETECTED Final   Norovirus GI/GII NOT DETECTED NOT DETECTED Final   Rotavirus A NOT DETECTED NOT DETECTED Final   Sapovirus (I, II, IV, and V) NOT DETECTED NOT DETECTED Final     Studies: Dg Chest 2 View  12/16/2015  CLINICAL DATA:  82 year old male with dyspnea EXAM: CHEST  2 VIEW COMPARISON:  None. FINDINGS: There is a focal area of slight increased density in the apical segment of the left lower lobe seen on the lateral projection, likely artifactual and go related to superimposition of the structures. Pneumonia is less likely. Clinical correlation is recommended. The lungs are otherwise clear. There is no pleural effusion or pneumothorax. The cardiac silhouette is within normal limits. No acute osseous pathology identified. IMPRESSION: Artifact versus less likely focal density in the superior segment of the left lower lobe seen on the lateral projection. Clinical correlation is recommended. Electronically Signed   By: Anner Crete M.D.   On: 12/16/2015 01:56   Nm Pulmonary Perf And Vent  12/16/2015  CLINICAL DATA:  82 year old male with shortness of breath EXAM: NUCLEAR MEDICINE VENTILATION - PERFUSION LUNG SCAN TECHNIQUE: Ventilation images were obtained in multiple projections using inhaled aerosol Tc-26m DTPA. Perfusion images were  obtained in multiple projections after intravenous injection of Tc-93m MAA. RADIOPHARMACEUTICALS:  32.6 mCi Technetium-34m DTPA aerosol inhalation and 4.19 mCi Technetium-69m MAA IV COMPARISON:  Chest radiograph dated 12/16/2015 FINDINGS: Ventilation: There is deposition of the radiotracer within the central airways compatible with chronic obstructive lung. There is heterogeneous uptake of the radiopharmaceutical. Perfusion: There is overall better perfusion of the lungs compare to the ventilation images. No wedge shaped peripheral perfusion defects to suggest acute pulmonary embolism. IMPRESSION: Low probability for pulmonary embolism. Electronically Signed   By: Anner Crete M.D.   On: 12/16/2015 01:34    Scheduled Meds: . sodium chloride   Intravenous Once  . antiseptic oral rinse  7 mL Mouth Rinse q12n4p  . calcium-vitamin D  1 tablet Oral Q breakfast  . carvedilol  12.5 mg Oral BID WC  . chlorhexidine  15 mL Mouth Rinse BID  . feeding supplement (ENSURE ENLIVE)  237 mL Oral BID BM  . insulin aspart  0-9 Units Subcutaneous TID WC  . insulin glargine  10 Units Subcutaneous QHS  . latanoprost  1 drop Both Eyes QHS  . magnesium oxide  400 mg Oral BID  . pantoprazole  40 mg Oral Daily  . sodium chloride flush  3 mL Intravenous Q12H  . terazosin  5 mg Oral QHS  . vancomycin  125 mg Oral Q6H    Continuous Infusions:    Time spent: 35 mins; 20 min spent in face-to-face contact with [name] [name] and family members discussing diagnosis, prognosis and medical goals of care   Kelvin Cellar MD  [facility] Pager [phone]. If 7PM-7AM, please contact night-coverage at [email or web address], password [facility] 12/16/2015, 12:59 PM  LOS: 3 days

## 2015-12-17 DIAGNOSIS — C259 Malignant neoplasm of pancreas, unspecified: Secondary | ICD-10-CM

## 2015-12-17 LAB — CBC
HEMATOCRIT: 25.5 % — AB (ref 39.0–52.0)
HEMOGLOBIN: 8.5 g/dL — AB (ref 13.0–17.0)
MCH: 29.6 pg (ref 26.0–34.0)
MCHC: 33.3 g/dL (ref 30.0–36.0)
MCV: 88.9 fL (ref 78.0–100.0)
Platelets: 166 10*3/uL (ref 150–400)
RBC: 2.87 MIL/uL — ABNORMAL LOW (ref 4.22–5.81)
RDW: 16.7 % — AB (ref 11.5–15.5)
WBC: 15.9 10*3/uL — ABNORMAL HIGH (ref 4.0–10.5)

## 2015-12-17 LAB — BASIC METABOLIC PANEL
ANION GAP: 7 (ref 5–15)
BUN: 22 mg/dL — AB (ref 6–20)
CO2: 24 mmol/L (ref 22–32)
Calcium: 7.5 mg/dL — ABNORMAL LOW (ref 8.9–10.3)
Chloride: 106 mmol/L (ref 101–111)
Creatinine, Ser: 0.98 mg/dL (ref 0.61–1.24)
GFR calc Af Amer: >60 mL/min (ref >60)
GFR calc non Af Amer: >60 mL/min (ref >60)
GLUCOSE: 263 mg/dL — AB (ref 65–99)
POTASSIUM: 4.7 mmol/L (ref 3.5–5.1)
Sodium: 137 mmol/L (ref 135–145)

## 2015-12-17 LAB — GLUCOSE, CAPILLARY
Glucose-Capillary: 264 mg/dL — ABNORMAL HIGH (ref 65–99)
Glucose-Capillary: 255 mg/dL — ABNORMAL HIGH (ref 65–99)
Glucose-Capillary: 225 mg/dL — ABNORMAL HIGH (ref 65–99)
Glucose-Capillary: 215 mg/dL — ABNORMAL HIGH (ref 65–99)

## 2015-12-17 NOTE — Progress Notes (Signed)
PROGRESS NOTE  [name] [name] [id number] DOB: 07-02-1929 DOA: 12/11/2015 PCP: No PCP Per Patient   Assessment/Plan: Active Problems:   Hypertension, essential   Non-insulin dependent type 2 diabetes mellitus ([place])   AKI (acute kidney injury) ([place])   Dehydration   Malnutrition of moderate degree   C. difficile colitis   Diarrhea   Pancreatic cancer metastasized to liver [facility])   Suspected metastatic pancreatic cancer.  -CT scan of abdomen and pelvis performed on 12/12/2015 revealing widespread hepatic metastatic disease, with radiology reporting pancreatic body mass and that there is suspicion for findings to represent metastatic pancreatic cancer. -He underwent ultrasound-guided biopsy of liver lesion on 12/13/2015, tolerated procedure well. -On 12/15/2015 I spoke with pathology reporting adenocarcinoma. Radiology had previously reported that metastatic pancreatic cancer was the favored unified diagnosis.  -I spoke with family members on 12/15/2015 regarding biopsy findings.  -Dr Irene Limbo of Medical Oncology consulted, recommended checking a CA 19-9 as well as AFP. Patient recently underwent GI workup on previous hospitalization that included EGD and colonoscopy, negative for malignancy.  -On 12/16/2015 had extensive discussion with Mr Spillman and his daughter discussing medical goals of care. Mr -Wiess expresses wishes to focus his care on comfort and at this time is not interested in pursuing chemotherapy.  Volume Overload -Overnight patient reporting increased shortness of breath, found to have increased work of breathing. He was placed on supplemental oxygen. On exam he had distended neck veins with lower extremity edema. -He was given 40 mg of IV Lasix 1 on 12/14/2015  -Transthoracic echocardiogram performed 12/13/2015 showing preserved ejection fraction 55% with grade 1 diastolic dysfunction -On [id number] patient reporting significant improvement to his breathing. -VQ scan  performed on 12/16/2015 showing low probability for pulmonary embolism  C. difficile colitis. -Patient reporting loose stools since undergoing colonic prep on recent hospitalization last week. Stool for C. difficile was checked yesterday evening coming back positive for C. difficile toxin. -He was placed on contact precautions and started on oral vancomycin. -Clinically shown improvement, diarrhea nearly resolving. Plan for a total of 14 days of oral vancomycin  Noninsulin dependent DM2.  -Blood sugars improved, continue Lantus 10 units subcutaneous daily  Left leg edema. L>R. [place] no DVT.    History of acute blood loss anemia:  -iron deficiency: Recent hospitalization in 11/2015 for GI bleeding (5/26-5/29). EGD: non bleeding angiodysplastic lesion s/p coagulation. (APM) -Hemoglobin trended down to 8.1 on 12/15/2015. Medical oncology recommending transfusion. On 12/15/2015 he was transfused 1 unit of packed red blood cells. Repeat labs showing hemoglobin of 9.0. Will continue to monitor.  HTN -His blood pressures remain elevated, increased Coreg from 6.25 mg to 12.5 mg by mouth twice a day on 12/14/2015 -Patient having subsequent improvement to his blood pressures  Medical goals of care. -On 12/16/2015 I spent time to discuss medical goals of care with Mr Touma and his daughter who was present at bedside. We discussed the option of chemotherapy and the potential burdens associated with this intervention. Mr.Bogle stated having family members who were treated with chemotherapy and witnessed them undergo the significant side effects. He thinks that he would not want to undergo this intervention and would prefer to focus his care on comfort. His daughter who is present at bedside supports his father's decision on this. He discussed the option of hospice however his daughter reports not wanting to discuss this now. She is fearful that discussion of hospice will bring her father's moral down.  -We  also talked to about  CODE STATUS. He did not wish for aerobic measures to be undertaken in the event of cardiopulmonary arrest. He states he would like to go comfortably. DO NOT RESUSCITATE order placed in chart.  Code Status: DO NOT RESUSCITATE  Family Communication: I spoke with his daughter  Disposition Plan: Plan to discharge to SNF in am   Consultants:  Interventional radiology  Procedures:  Ultrasound-guided biopsy of liver lesion on 12/13/2015  Antibiotics:  none   Objective: BP 120/75 mmHg  Pulse 89  Temp(Src) 99.9 F (37.7 C) (Oral)  Resp 18  Ht 5\' 10"  (1.778 m)  Wt 91.1 kg (200 lb 13.4 oz)  BMI 28.82 kg/m2  SpO2 100%  Intake/Output Summary (Last 24 hours) at 12/17/15 1256 Last data filed at 12/16/15 1743  Gross per 24 hour  Intake    280 ml  Output    200 ml  Net     80 ml   Filed Weights   12/11/15 1252  Weight: 91.1 kg (200 lb 13.4 oz)    Exam:   General:  Frail, Chronically ill-appearing, Appears more interactive today.  Cardiovascular: RRR, has positive jugular venous distention, left greater than right extremity edema  Respiratory: Fine crackles to bases bilaterally  Abdomen: Soft/ND/NT, positive BS  Musculoskeletal: left leg edema  Neuro:aaox3, flat affect, mild psychomotor retardation ( daughter reported patient's wife died two years ago and a grandchild died last year, patient has not been motivated to do much for the last two years), patient denies depression.   Data Reviewed: Basic Metabolic Panel:  Recent Labs Lab 12/11/15 0816 12/12/15 0449 12/13/15 0502 12/14/15 [id number] 12/15/15 0416 12/16/15 0421 12/17/15 0416  NA 140 140 138 137 139 137 137  K 3.8 3.9 3.8 4.2 4.3 4.5 4.7  CL 107 109 108 109 109 106 106  CO2 21* 21* 20* 21* 22 22 24   GLUCOSE 154* 141* 145* 223* 129* 245* 263*  BUN 30* 23* 17 17 16  22* 22*  CREATININE 1.82* 1.37* 0.97 1.03 0.98 1.09 0.98  CALCIUM 6.1* 6.1* 6.4* 6.7* 6.8* 7.1* 7.5*  MG 0.9* 1.5* 1.5*  1.4*  --   --   --    Liver Function Tests:  Recent Labs Lab 12/11/15 0816 12/12/15 0449 12/13/15 0502 12/14/15 0948  AST 121* 120* 123* 115*  ALT 47 45 47 49  ALKPHOS 285* 253* 296* 292*  BILITOT 1.5* 1.6* 1.7* 1.0  PROT 5.8* 5.4* 5.5* 5.5*  ALBUMIN 2.5* 2.3* 2.3* 2.3*    Recent Labs Lab 12/11/15 1352  LIPASE 29   No results for input(s): AMMONIA in the last 168 hours. CBC:  Recent Labs Lab 12/13/15 0502 12/14/15 0948 12/15/15 0416 12/16/15 0421 12/17/15 0416  WBC 13.7* 19.6* 16.2* 15.5* 15.9*  HGB 8.3* 8.2* 8.1* 9.0* 8.5*  HCT 25.1* 24.8* 24.5* 26.8* 25.5*  MCV 88.1 86.1 88.1 88.4 88.9  PLT 182 169 166 181 166   Cardiac Enzymes:    Recent Labs Lab 12/11/15 1352  CKTOTAL 200   BNP (last 3 results) No results for input(s): BNP in the last 8760 hours.  ProBNP (last 3 results) No results for input(s): PROBNP in the last 8760 hours.  CBG:  Recent Labs Lab 12/16/15 1156 12/16/15 1653 12/16/15 2129 12/17/15 0758 12/17/15 1203  GLUCAP 195* 193* 209* 264* 255*    Recent Results (from the past 240 hour(s))  C difficile quick scan w PCR reflex     Status: Abnormal   Collection Time: 12/13/15  6:54 PM  Result Value Ref Range Status   C Diff antigen POSITIVE (A) NEGATIVE Final   C Diff toxin POSITIVE (A) NEGATIVE Final   C Diff interpretation Positive for toxigenic C. difficile  Final    Comment: CRITICAL RESULT CALLED TO, READ BACK BY AND VERIFIED WITH: Elwin Sleight RN 6.7.17 @ 2139 BY RICEJ   Gastrointestinal Panel by PCR , Stool     Status: None   Collection Time: 12/13/15  6:54 PM  Result Value Ref Range Status   Campylobacter species NOT DETECTED NOT DETECTED Final   Plesimonas shigelloides NOT DETECTED NOT DETECTED Final   Salmonella species NOT DETECTED NOT DETECTED Final   Yersinia enterocolitica NOT DETECTED NOT DETECTED Final   Vibrio species NOT DETECTED NOT DETECTED Final   Vibrio cholerae NOT DETECTED NOT DETECTED Final    Enteroaggregative E coli (EAEC) NOT DETECTED NOT DETECTED Final   Enteropathogenic E coli (EPEC) NOT DETECTED NOT DETECTED Final   Enterotoxigenic E coli (ETEC) NOT DETECTED NOT DETECTED Final   Shiga like toxin producing E coli (STEC) NOT DETECTED NOT DETECTED Final   E. coli O157 NOT DETECTED NOT DETECTED Final   Shigella/Enteroinvasive E coli (EIEC) NOT DETECTED NOT DETECTED Final   Cryptosporidium NOT DETECTED NOT DETECTED Final   Cyclospora cayetanensis NOT DETECTED NOT DETECTED Final   Entamoeba histolytica NOT DETECTED NOT DETECTED Final   Giardia lamblia NOT DETECTED NOT DETECTED Final   Adenovirus F40/41 NOT DETECTED NOT DETECTED Final   Astrovirus NOT DETECTED NOT DETECTED Final   Norovirus GI/GII NOT DETECTED NOT DETECTED Final   Rotavirus A NOT DETECTED NOT DETECTED Final   Sapovirus (I, II, IV, and V) NOT DETECTED NOT DETECTED Final     Studies: No results found.  Scheduled Meds: . sodium chloride   Intravenous Once  . antiseptic oral rinse  7 mL Mouth Rinse q12n4p  . calcium-vitamin D  1 tablet Oral Q breakfast  . carvedilol  12.5 mg Oral BID WC  . chlorhexidine  15 mL Mouth Rinse BID  . feeding supplement (ENSURE ENLIVE)  237 mL Oral BID BM  . insulin aspart  0-9 Units Subcutaneous TID WC  . insulin glargine  10 Units Subcutaneous QHS  . latanoprost  1 drop Both Eyes QHS  . magnesium oxide  400 mg Oral BID  . pantoprazole  40 mg Oral Daily  . sodium chloride flush  3 mL Intravenous Q12H  . terazosin  5 mg Oral QHS  . vancomycin  125 mg Oral Q6H    Continuous Infusions:    Time spent: 15 min Kelvin Cellar MD  [facility] Pager [phone]. If 7PM-7AM, please contact night-coverage at [email or web address], password [facility] 12/17/2015, 12:56 PM  LOS: 4 days

## 2015-12-18 LAB — GLUCOSE, CAPILLARY
GLUCOSE-CAPILLARY: 180 mg/dL — AB (ref 65–99)
GLUCOSE-CAPILLARY: 239 mg/dL — AB (ref 65–99)

## 2015-12-18 LAB — TYPE AND SCREEN
ABO/RH(D): O POS
Antibody Screen: NEGATIVE
Unit division: 0

## 2015-12-18 MED ORDER — CALCIUM CARBONATE-VITAMIN D 500-200 MG-UNIT PO TABS: 1.0000 | ORAL_TABLET | Freq: Every day | ORAL | Status: DC

## 2015-12-18 MED ORDER — CARVEDILOL 12.5 MG PO TABS: 12.5000 mg | ORAL_TABLET | Freq: Two times a day (BID) | ORAL | Status: DC

## 2015-12-18 MED ORDER — INSULIN GLARGINE 100 UNIT/ML ~~LOC~~ SOLN: 10.0000 [IU] | Freq: Every day | SUBCUTANEOUS | Status: DC

## 2015-12-18 MED ORDER — VANCOMYCIN 50 MG/ML ORAL SOLUTION: 125.0000 mg | Freq: Four times a day (QID) | ORAL | Status: DC

## 2015-12-18 NOTE — Progress Notes (Signed)
D/c to [facility], Report given to [employer].

## 2015-12-18 NOTE — Discharge Summary (Signed)
Physician Discharge Summary  [name] [name] [id number] DOB: 02-May-1929 DOA: 12/11/2015  PCP: No PCP Per Patient  Admit date: 12/11/2015 Discharge date: 12/18/2015  Time spent: 35 minutes  Recommendations for Outpatient Follow-up:  1. Vancomycin 125 mg PO TID for 10 days then stop for treatment of C diff colitis 2. Please follow up on blood sugars he was discharged on Lantus 10 units [place] q daily 3. CBC in 1 week, has h/o GI bleed.  4. Will need follow up with Dr Irene Limbo of Medical Oncology for suspected metastatic pancreatic cancer   Discharge Diagnoses:  Active Problems:   Hypertension, essential   Non-insulin dependent type 2 diabetes mellitus (HCC)   AKI (acute kidney injury) ([place])   Dehydration   Malnutrition of moderate degree   C. difficile colitis   Diarrhea   Pancreatic cancer metastasized to liver [facility])   Discharge Condition: Stable/improved  Diet recommendation: Regular diet  Filed Weights   12/11/15 1252  Weight: 91.1 kg (200 lb 13.4 oz)    History of present illness:  [name] [name] is a 82 y.o. male with PMH of HTN, DM, Gout, Recent hospitalization for GI bleeding (5/26-5/29) presented with generalized weakness, poor oral intake for several days. Family brought him to the emeregency room due to concern for dehydration. Found to have hypomagnesemia, hypocalcemia and acute renal failure in ED. Patient denies acute abdominal pains, no diarrhea, no vomiting, but reports poor appetite and "food does not taste". He denies acute chest pains, no SOB, no fever, no focal neuro symptoms  -ED: AKI, dehydration. hospitalist is called for overnight observation, hydration   Hospital Course:  Suspected metastatic pancreatic cancer.  -CT scan of abdomen and pelvis performed on 12/12/2015 revealing widespread hepatic metastatic disease, with radiology reporting pancreatic body mass and that there is suspicion for findings to represent metastatic pancreatic cancer. -He underwent  ultrasound-guided biopsy of liver lesion on 12/13/2015, tolerated procedure well. -On 12/15/2015 I spoke with pathology reporting adenocarcinoma. Radiology had previously reported that metastatic pancreatic cancer was the favored unified diagnosis.  -I spoke with family members on 12/15/2015 regarding biopsy findings.  -Dr Irene Limbo of Medical Oncology consulted, recommended checking a CA 19-9 as well as AFP. Patient recently underwent GI workup on previous hospitalization that included EGD and colonoscopy, negative for malignancy.  -On 12/16/2015 had extensive discussion with [name] [name] and his daughter discussing medical goals of care. Initially was not interested in systemic therapy -On 12/18/2015 family members wanting to discuss further treatment options. Was given a follow up appointment with Dr Irene Limbo of medical oncology at the cancer center.   Volume Overload -During this hospitalization reported increased shortness of breath, found to have increased work of breathing. He was placed on supplemental oxygen. On exam he had distended neck veins with lower extremity edema. -He was given 40 mg of IV Lasix 1 on 12/14/2015  -Transthoracic echocardiogram performed 12/13/2015 showing preserved ejection fraction 55% with grade 1 diastolic dysfunction -On [id number] patient reporting significant improvement to his breathing. -VQ scan performed on 12/16/2015 showing low probability for pulmonary embolism -On 12/18/2015 he was satting 97% on room air  C. difficile colitis. -Patient reporting loose stools since undergoing colonic prep on recent hospitalization last week. Stool for C. difficile was checked yesterday evening coming back positive for C. difficile toxin. -He was placed on contact precautions and started on oral vancomycin. -Clinically shown improvement, diarrhea nearly resolving. Plan for a total of 14 days of oral vancomycin  Noninsulin dependent DM2.  -  Blood sugars improved -Lantus 10  units subcutaneous daily, please follow up on blood sugars  Left leg edema. L>R. [place] no DVT.   History of acute blood loss anemia:  -iron deficiency: Recent hospitalization in 11/2015 for GI bleeding (5/26-5/29). EGD: non bleeding angiodysplastic lesion s/p coagulation. (APM) -Hemoglobin trended down to 8.1 on 12/15/2015. Medical oncology recommending transfusion. On 12/15/2015 he was transfused 1 unit of packed red blood cells. Repeat labs showing hemoglobin of 9.0. -Please repeat CBC in 1 week.   HTN -His blood pressures remain elevated, increased Coreg from 6.25 mg to 12.5 mg by mouth twice a day on 12/14/2015 -Patient having subsequent improvement to his blood pressures  Procedures:  Ultrasound-guided biopsy of liver lesion on 12/13/2015  Consultations:  Medical Oncology  Physical therapy  Discharge Exam: Filed Vitals:   12/17/15 2114 12/18/15 0547  BP: 115/67 135/66  Pulse: 86 86  Temp: 99 F (37.2 C) 98.6 F (37 C)  Resp: 16 16     General: Frail, Chronically ill-appearing, seems to be doing better, we ambulated him down the hallway with out supplemental oxygen  Cardiovascular: RRR, has positive jugular venous distention, left greater than right extremity edema  Respiratory: Fine crackles to bases bilaterally  Abdomen: Soft/ND/NT, positive BS  Musculoskeletal: left leg edema  Neuro:aaox3, flat affect, mild psychomotor retardation ( daughter reported patient's wife died two years ago and a grandchild died last year, patient has not been motivated to do much for the last two years), patient denies depression.  Discharge Instructions   Discharge Instructions    Call MD for:  difficulty breathing, headache or visual disturbances    Complete by:  As directed      Call MD for:  extreme fatigue    Complete by:  As directed      Call MD for:  hives    Complete by:  As directed      Call MD for:  persistant dizziness or light-headedness    Complete by:  As  directed      Call MD for:  persistant nausea and vomiting    Complete by:  As directed      Call MD for:  redness, tenderness, or signs of infection (pain, swelling, redness, odor or green/yellow discharge around incision site)    Complete by:  As directed      Call MD for:  severe uncontrolled pain    Complete by:  As directed      Call MD for:  temperature >100.4    Complete by:  As directed      Call MD for:    Complete by:  As directed      Diet - low sodium heart healthy    Complete by:  As directed      Increase activity slowly    Complete by:  As directed           Current Discharge Medication List    START taking these medications   Details  calcium-vitamin D (OSCAL WITH D) 500-200 MG-UNIT tablet Take 1 tablet by mouth daily with breakfast. Qty: 30 tablet, Refills: 0    carvedilol (COREG) 12.5 MG tablet Take 1 tablet (12.5 mg total) by mouth 2 (two) times daily with a meal. Qty: 60 tablet, Refills: 1    insulin glargine (LANTUS) 100 UNIT/ML injection Inject 0.1 mLs (10 Units total) into the skin at bedtime. Qty: 10 mL, Refills: 11    vancomycin (VANCOCIN) 50 mg/mL oral solution Take 2.5  mLs (125 mg total) by mouth every 6 (six) hours. Qty: 10 mL, Refills: 0      CONTINUE these medications which have NOT CHANGED   Details  atorvastatin (LIPITOR) 20 MG tablet Take 20 mg by mouth daily.    latanoprost (XALATAN) 0.005 % ophthalmic solution Place 1 drop into both eyes at bedtime. Refills: 1    pantoprazole (PROTONIX) 40 MG tablet Take 1 tablet (40 mg total) by mouth daily. Qty: 30 tablet, Refills: 1    terazosin (HYTRIN) 5 MG capsule Take 5 mg by mouth at bedtime.       STOP taking these medications     colchicine 0.6 MG tablet      glipiZIDE (GLUCOTROL XL) 10 MG 24 hr tablet      losartan (COZAAR) 100 MG tablet      metFORMIN (GLUCOPHAGE) 500 MG tablet      saxagliptin HCl (ONGLYZA) 5 MG TABS tablet      VESICARE 5 MG tablet        Allergies   Allergen Reactions  . Ace Inhibitors     Listed on allergy list in wallet; unk reason per pt  . Capoten [Captopril]     Listed on allergy list in wallet; unk reason per pt  . Glucophage [Metformin]     Listed on pt's med card under allergies, but pt currently on metformin  . Vasotec [Enalapril]     Listed on allergy list in pt's wallet; unk reason per pt   Follow-up Information    Follow up with Sullivan Lone, MD In 1 week.   Specialties:  Hematology, Oncology   Contact information:   [place] [address] [phone]        The results of significant diagnostics from this hospitalization (including imaging, microbiology, ancillary and laboratory) are listed below for reference.    Significant Diagnostic Studies: Ct Abdomen Pelvis Wo Contrast  12/12/2015  CLINICAL DATA:  82 year old male with abnormal LFTs. Multiple liver masses detected on recent ultrasound. Initial encounter. EXAM: CT ABDOMEN AND PELVIS WITHOUT CONTRAST TECHNIQUE: Multidetector CT imaging of the abdomen and pelvis was performed following the standard protocol without IV contrast. COMPARISON:  Abdomen ultrasound 12/12/2015. FINDINGS: Small bilateral layering pleural effusions. Right lung base atelectasis. No suspicious lung base nodule or mass identified. Bulky endplate degenerative spurring in the lower thoracic spine. Advanced lower lumbar disc and facet degeneration. No acute or suspicious osseous lesion identified. Mild nonspecific presacral stranding. The prostate appears enlarged and heterogeneous (note heterogeneity at the superior aspect of the prostate on series 2, image 79). The gland encompasses 6-7 cm diameter. The urinary bladder is largely decompressed. There is a large 4.4 cm oval lamellated bladder stone on the right (series 2, image 77). There are superimposed small calcified pelvic phleboliths. There is mild rectal wall thickening. The rectum is mildly distended with low-density stool. The  distal sigmoid is decompressed. The proximal sigmoid is redundant and mildly gas distended. There is moderate diverticulosis at the junction of the sigmoid and descending colon segments. There is a small volume of free fluid in the left gutter. The left colon otherwise appears negative. Mild motion artifact at the transverse colon which appears negative aside from retained stool. Mild to moderate diverticulosis of the right colon. Negative appendix. Oral contrast has almost reached the terminal ileum. No dilated small bowel. Oral contrast distends the stomach which otherwise appears normal. No definite duodenal abnormality. Small volume of free fluid in the right gutter  and at the inferior right liver tip, greater than the volume of fluid on the left. Enlarged and heterogeneous liver containing numerous hypodense masses, which coalesce 2 lesions up to 8 cm diameter. All liver lobes appear affected. Small volume perihepatic fluid anteriorly and at the dome. The gallbladder appears contracted with wall thickening and pericholecystic fluid, but no significant inflammatory stranding emanating from the gallbladder fossa. Noncontrast spleen is within normal limits. There is evidence of a 2.5 cm hypodense mass at the body of the pancreas as seen on series 2, image 36. No pancreatic ductal dilatation is evident. The head and uncinate process have a more normal appearance, although there is associated peripancreatic lymphadenopathy including up to 2.8 cm abnormal nodes or ex nodal disease at the portal caval station (series 2, image 35). Mild thickening of the adrenal glands which otherwise have a negative noncontrast appearance. Nonspecific mild to moderate perinephric stranding. There is a a oval mildly lobulated 16 mm kidney stone at the left lower pole with no associated left hydronephrosis. No definite hydroureter. Tortuous distal abdominal aorta with calcified atherosclerosis. Tortuous proximal iliac arteries. Vascular  patency not evaluated in the absence of IV contrast. IMPRESSION: 1. Noncontrast study demonstrating evidence of widespread hepatic metastatic disease. Associated porta hepatis and upper abdominal lymphadenopathy. Small volume abdominal free fluid. Small bilateral pleural effusions. 2. Evidence of a 2.6 cm hypodense pancreatic body mass, such that metastatic pancreatic tumor is the favored unifying diagnosis at this time. 3. However, the above findings would be much better characterized on an IV contrast-enhanced study. In the inpatient setting Pancreas protocol CT of the Abdomen with IV contrast may be most appropriate. 4. Enlarged and heterogeneous prostate also noted, such that the main differential consideration may be metastatic prostate cancer. However, there is no evidence of metastatic disease to the skeleton. 5. Incidentally noted large bladder calculus and left lower pole nonobstructing renal calculus. 6. Calcified aortic atherosclerosis. Electronically Signed   By: Genevie Ann M.D.   On: 12/12/2015 15:00   Dg Chest 1 View  12/14/2015  CLINICAL DATA:  Hypertension.  Diabetes EXAM: CHEST 1 VIEW COMPARISON:  12/11/2015 FINDINGS: Normal heart size. Decreased lung volumes. No pleural effusion or edema. No airspace consolidation. IMPRESSION: 1. Decreased lung volumes. 2. No acute findings. Electronically Signed   By: Kerby Moors M.D.   On: 12/14/2015 12:13   Dg Chest 2 View  12/16/2015  CLINICAL DATA:  82 year old male with dyspnea EXAM: CHEST  2 VIEW COMPARISON:  None. FINDINGS: There is a focal area of slight increased density in the apical segment of the left lower lobe seen on the lateral projection, likely artifactual and go related to superimposition of the structures. Pneumonia is less likely. Clinical correlation is recommended. The lungs are otherwise clear. There is no pleural effusion or pneumothorax. The cardiac silhouette is within normal limits. No acute osseous pathology identified.  IMPRESSION: Artifact versus less likely focal density in the superior segment of the left lower lobe seen on the lateral projection. Clinical correlation is recommended. Electronically Signed   By: Anner Crete M.D.   On: 12/16/2015 01:56   Dg Chest 2 View  12/11/2015  CLINICAL DATA:  82 year old male with recent increase in weakness. Cough, loss of appetite. EXAM: CHEST  2 VIEW COMPARISON:  Chest x-ray 12/01/2015. FINDINGS: Mild elevation of the right hemidiaphragm (chronic and unchanged). Lung volumes are normal. No consolidative airspace disease. No pleural effusions. No pneumothorax. No pulmonary nodule or mass noted. Pulmonary vasculature and the  cardiomediastinal silhouette are within normal limits. Atherosclerosis in the thoracic aorta. IMPRESSION: 1. No radiographic evidence of acute cardiopulmonary disease. Electronically Signed   By: Vinnie Langton M.D.   On: 12/11/2015 08:39   Dg Chest 2 View  12/01/2015  CLINICAL DATA:  Acute onset of bilateral leg weakness. Initial encounter. EXAM: CHEST  2 VIEW COMPARISON:  Chest radiograph performed 09/12/2013 FINDINGS: The lungs are mildly hypoexpanded. There is mild elevation of the right hemidiaphragm. There is no evidence of focal opacification, pleural effusion or pneumothorax. The heart is borderline normal in size. No acute osseous abnormalities are seen. IMPRESSION: Lungs mildly hypoexpanded but grossly clear, with mild elevation of the right hemidiaphragm. Electronically Signed   By: Garald Balding M.D.   On: 12/01/2015 21:31   Ct Head Wo Contrast  12/01/2015  CLINICAL DATA:  Weakness.  Dizziness for several days. EXAM: CT HEAD WITHOUT CONTRAST TECHNIQUE: Contiguous axial images were obtained from the base of the skull through the vertex without intravenous contrast. COMPARISON:  Head CT 09/12/2013 FINDINGS: No intracranial hemorrhage, mass effect, or midline shift. The degree of atrophy and chronic small vessel ischemia is unchanged from prior  exam. No hydrocephalus. The basilar cisterns are patent. No evidence of territorial infarct. No intracranial fluid collection. Atherosclerosis of skullbase vasculature. Calvarium is intact. Included paranasal sinuses and mastoid air cells are well aerated. Improved mucosal thickening of the ethmoid air cells and maxillary sinuses. IMPRESSION: 1.  No acute intracranial abnormality. 2. Atrophy and chronic small vessel ischemia, unchanged from prior exam. Electronically Signed   By: Jeb Levering M.D.   On: 12/01/2015 21:40   US Renal  12/12/2015  CLINICAL DATA:  Increased BUN and creatinine, edema EXAM: RENAL / URINARY TRACT ULTRASOUND COMPLETE COMPARISON:  None. FINDINGS: Right Kidney: Length: 11 cm. Echogenicity within normal limits. No mass or hydronephrosis visualized. Left Kidney: Length: 11.4 cm. Normal echogenicity with no hydronephrosis. 2 x 2 x 2.2 cm hypoechoic mass interpolar region left kidney. 15 mm echogenic focus with shadowing in the lower pole suggesting stone. Bladder: 4.2 cm echogenic focus within the bladder causing posterior acoustic shadowing possibly representing a large calculus. Prostate is enlarged at 65 x 64 x 67 mm. IMPRESSION: 1. Indeterminate left renal mass versus cyst. If the patient cannot undergo contrast-enhanced CT scan consider renal protocol MRI. 2. Nonobstructing 15 mm left renal stone 3. 4 cm bladder calculus.  Enlargement of the prostate. Electronically Signed   By: Skipper Cliche M.D.   On: 12/12/2015 08:57   Nm Pulmonary Perf And Vent  12/16/2015  CLINICAL DATA:  82 year old male with shortness of breath EXAM: NUCLEAR MEDICINE VENTILATION - PERFUSION LUNG SCAN TECHNIQUE: Ventilation images were obtained in multiple projections using inhaled aerosol Tc-31m DTPA. Perfusion images were obtained in multiple projections after intravenous injection of Tc-59m MAA. RADIOPHARMACEUTICALS:  32.6 mCi Technetium-57m DTPA aerosol inhalation and 4.19 mCi Technetium-47m MAA IV  COMPARISON:  Chest radiograph dated 12/16/2015 FINDINGS: Ventilation: There is deposition of the radiotracer within the central airways compatible with chronic obstructive lung. There is heterogeneous uptake of the radiopharmaceutical. Perfusion: There is overall better perfusion of the lungs compare to the ventilation images. No wedge shaped peripheral perfusion defects to suggest acute pulmonary embolism. IMPRESSION: Low probability for pulmonary embolism. Electronically Signed   By: Anner Crete M.D.   On: 12/16/2015 01:34   US Biopsy  12/13/2015  INDICATION: Liver lesions EXAM: ULTRASOUND-GUIDED BIOPSY OF A LIVER LESION.  CORE. MEDICATIONS: None. ANESTHESIA/SEDATION: Fentanyl 25 mcg IV; Versed 1  mg IV Moderate Sedation Time:  14 The patient was continuously monitored during the procedure by the interventional radiology nurse under my direct supervision. FLUOROSCOPY TIME:  Fluoroscopy Time:  minutes  seconds ( mGy). COMPLICATIONS: None immediate. PROCEDURE: Informed written consent was obtained from the patient after a thorough discussion of the procedural risks, benefits and alternatives. All questions were addressed. Maximal Sterile Barrier Technique was utilized including caps, mask, sterile gowns, sterile gloves, sterile drape, hand hygiene and skin antiseptic. A timeout was performed prior to the initiation of the procedure. The right flank was prepped with ChloraPrep in a sterile fashion, and a sterile drape was applied covering the operative field. A sterile gown and sterile gloves were used for the procedure. Under sonographic guidance, an 35 gauge guide needle was advanced into the right lobe of the liver, within a lesion. Subsequently 2 18 gauge core biopsies were obtained. Gel-Foam slurry was injected into the tract. The guide needle was removed. Final imaging was performed. Patient tolerated the procedure well without complication. Vital sign monitoring by nursing staff during the procedure will  continue as patient is in the special procedures unit for post procedure observation. FINDINGS: The images document guide needle placement within the right lobe liver lesion. Post biopsy images demonstrate no hemorrhage. IMPRESSION: Successful ultrasound-guided core biopsy of a right lobe liver lesion. Electronically Signed   By: Marybelle Killings M.D.   On: 12/13/2015 13:09   Dg Foot 2 Views Left  11/30/2015  CLINICAL DATA:  Swollen, painful left great toe EXAM: LEFT FOOT - 2 VIEW COMPARISON:  None. FINDINGS: Tarsal-metatarsal alignment is normal. No fracture is seen. No definite erosion is noted. Joint spaces are relatively well preserved for age. There is faint opacity within the soft tissues adjacent to the left first metatarsal head which could represent faint calcification possibly indicative of gout. There is pes planus present. There are degenerative changes in the midfoot with degenerative calcaneal spurs also noted. IMPRESSION: 1. No acute abnormality. 2. Questionable faint calcification in the soft tissues adjacent to the left first MTP joint could indicate gout but no erosions are noted. 3. Pes planus with degenerative changes in the mid and hindfoot. Electronically Signed   By: Ivar Drape M.D.   On: 11/30/2015 15:12   US Abdomen Limited Ruq  12/12/2015  CLINICAL DATA:  82 year old male inpatient with elevated liver function tests. EXAM: US ABDOMEN LIMITED - RIGHT UPPER QUADRANT COMPARISON:  None. FINDINGS: Gallbladder: Contracted gallbladder, limiting gallbladder evaluation. No definite gallbladder wall thickening accounting for the contracted state. No gallstones, pericholecystic fluid or sonographic Murphy sign. Common bile duct: Diameter: 2 mm Liver: There are multiple (greater than 5) hypoechoic liver masses scattered throughout the liver measuring up to 6.3 x 5.7 x 6.4 cm in the right liver lobe. The background liver parenchymal echotexture and echogenicity are normal. Trace perihepatic ascites.  IMPRESSION: 1. Multiple indeterminate hypoechoic liver masses scattered throughout the liver measuring up to 6.4 cm in the right liver lobe, worrisome for liver metastases. Further evaluation with CT abdomen/pelvis with IV and oral contrast is recommended. 2. Trace perihepatic ascites. 3. Contracted gallbladder with no gallstones. Electronically Signed   By: Ilona Sorrel M.D.   On: 12/12/2015 09:00    Microbiology: Recent Results (from the past 240 hour(s))  C difficile quick scan w PCR reflex     Status: Abnormal   Collection Time: 12/13/15  6:54 PM  Result Value Ref Range Status   C Diff antigen POSITIVE (A) NEGATIVE Final  C Diff toxin POSITIVE (A) NEGATIVE Final   C Diff interpretation Positive for toxigenic C. difficile  Final    Comment: CRITICAL RESULT CALLED TO, READ BACK BY AND VERIFIED WITH: Elwin Sleight RN 6.7.17 @ 2139 BY RICEJ   Gastrointestinal Panel by PCR , Stool     Status: None   Collection Time: 12/13/15  6:54 PM  Result Value Ref Range Status   Campylobacter species NOT DETECTED NOT DETECTED Final   Plesimonas shigelloides NOT DETECTED NOT DETECTED Final   Salmonella species NOT DETECTED NOT DETECTED Final   Yersinia enterocolitica NOT DETECTED NOT DETECTED Final   Vibrio species NOT DETECTED NOT DETECTED Final   Vibrio cholerae NOT DETECTED NOT DETECTED Final   Enteroaggregative E coli (EAEC) NOT DETECTED NOT DETECTED Final   Enteropathogenic E coli (EPEC) NOT DETECTED NOT DETECTED Final   Enterotoxigenic E coli (ETEC) NOT DETECTED NOT DETECTED Final   Shiga like toxin producing E coli (STEC) NOT DETECTED NOT DETECTED Final   E. coli O157 NOT DETECTED NOT DETECTED Final   Shigella/Enteroinvasive E coli (EIEC) NOT DETECTED NOT DETECTED Final   Cryptosporidium NOT DETECTED NOT DETECTED Final   Cyclospora cayetanensis NOT DETECTED NOT DETECTED Final   Entamoeba histolytica NOT DETECTED NOT DETECTED Final   Giardia lamblia NOT DETECTED NOT DETECTED Final   Adenovirus  F40/41 NOT DETECTED NOT DETECTED Final   Astrovirus NOT DETECTED NOT DETECTED Final   Norovirus GI/GII NOT DETECTED NOT DETECTED Final   Rotavirus A NOT DETECTED NOT DETECTED Final   Sapovirus (I, II, IV, and V) NOT DETECTED NOT DETECTED Final     Labs: Basic Metabolic Panel:  Recent Labs Lab 12/12/15 0449 12/13/15 0502 12/14/15 0948 12/15/15 0416 12/16/15 0421 12/17/15 0416  NA 140 138 137 139 137 137  K 3.9 3.8 4.2 4.3 4.5 4.7  CL 109 108 109 109 106 106  CO2 21* 20* 21* 22 22 24   GLUCOSE 141* 145* 223* 129* 245* 263*  BUN 23* 17 17 16  22* 22*  CREATININE 1.37* 0.97 1.03 0.98 1.09 0.98  CALCIUM 6.1* 6.4* 6.7* 6.8* 7.1* 7.5*  MG 1.5* 1.5* 1.4*  --   --   --    Liver Function Tests:  Recent Labs Lab 12/12/15 0449 12/13/15 0502 12/14/15 0948  AST 120* 123* 115*  ALT 45 47 51  ALKPHOS 253* 296* 292*  BILITOT 1.6* 1.7* 1.0  PROT 5.4* 5.5* 5.5*  ALBUMIN 2.3* 2.3* 2.3*    Recent Labs Lab 12/11/15 1352  LIPASE 29   No results for input(s): AMMONIA in the last 168 hours. CBC:  Recent Labs Lab 12/13/15 0502 12/14/15 0948 12/15/15 0416 12/16/15 0421 12/17/15 0416  WBC 13.7* 19.6* 16.2* 15.5* 15.9*  HGB 8.3* 8.2* 8.1* 9.0* 8.5*  HCT 25.1* 24.8* 24.5* 26.8* 25.5*  MCV 88.1 86.1 88.1 88.4 88.9  PLT 182 169 166 181 166   Cardiac Enzymes:  Recent Labs Lab 12/11/15 1352  CKTOTAL 200   BNP: BNP (last 3 results) No results for input(s): BNP in the last 8760 hours.  ProBNP (last 3 results) No results for input(s): PROBNP in the last 8760 hours.  CBG:  Recent Labs Lab 12/17/15 0758 12/17/15 1203 12/17/15 1642 12/17/15 2111 12/18/15 0733  GLUCAP 264* 255* 225* 215* 180*       Signed:  Kelvin Cellar MD.  [facility] 12/18/2015, 11:53 AM

## 2015-12-18 NOTE — Care Management Important Message (Signed)
Important Message  Patient Details IM Letter given to Cookie/Case Manager to present to Patient Name: [name] [name] MRN: [id number] Date of Birth: 1928/08/19   Medicare Important Message Given:  Yes    Camillo Flaming 12/18/2015, 10:22 AMImportant Message  Patient Details  Name: [name] [name] MRN: [id number] Date of Birth: 18-Apr-1929   Medicare Important Message Given:  Yes    Camillo Flaming 12/18/2015, 10:22 AM

## 2015-12-18 NOTE — Clinical Social Work Placement (Signed)
   CLINICAL SOCIAL WORK PLACEMENT  NOTE  Date:  12/18/2015  Patient Details  Name: [name] [name] MRN: [id number] Date of Birth: 1928/07/23  Clinical Social Work is seeking post-discharge placement for this patient at the [place] level of care (*CSW will initial, date and re-position this form in  chart as items are completed):  Yes   Patient/family provided with [place] Work Department's list of facilities offering this level of care within the geographic area requested by the patient (or if unable, by the patient's family).  Yes   Patient/family informed of their freedom to choose among providers that offer the needed level of care, that participate in Medicare, Medicaid or managed care program needed by the patient, have an available bed and are willing to accept the patient.  Yes   Patient/family informed of [facility]'s ownership interest in [facility] and [facility], as well as of the fact that they are under no obligation to receive care at these facilities.  PASRR submitted to EDS on 12/14/15     PASRR number received on 12/14/15     Existing PASRR number confirmed on       FL2 transmitted to all facilities in geographic area requested by pt/family on 12/14/15     FL2 transmitted to all facilities within larger geographic area on       Patient informed that his/her managed care company has contracts with or will negotiate with certain facilities, including the following:        Yes   Patient/family informed of bed offers received.  Patient chooses bed at [facility]     Physician recommends and patient chooses bed at     SNF Patient to be transferred to [facility] on  .  12/18/2015   Patient to be transferred to facility by     EMS  Patient family notified on   of transfer. Daughter   Name of family member notified:      [facility]  PHYSICIAN       Additional Comment:     _______________________________________________ Lilly Cove, LCSW 12/18/2015, 1:13 PM

## 2015-12-18 NOTE — Progress Notes (Addendum)
LCSW following for DC planning. Reviewed MD notes, with possible DC in AM to SNF Patient accepted to [facility] for SNF. Needing/pending Blue Medicare Auth:  This has been faxed in and under review.  (completed, see below)  Insurance Authorization:  [id number]  With next review date: 6/14.  RUG level:  [facility] [employer] is contacting facility. All notes sent to facility and aware of DC. Agreeable to come today. Call placed to patient daughter: [name]. She is agreeable to plan to DC to SNF. Patient will require EMS per family.   No other needs, DC to SNF.    Lane Hacker, MSW Clinical Social Work: [employer] [phone]

## 2015-12-18 NOTE — Progress Notes (Signed)
Physical Therapy Treatment Patient Details Name: [name] [name] MRN: [id number] DOB: 1929/03/06 Today's Date: Dec 26, 2015    History of Present Illness 82 y.o. male with PMH of HTN, DM, Gout, Recent hospitalization for GI bleeding (5/26-5/29) presented with generalized weakness, diagnosed with AKI, dehydration, hypomagnesemia, hypocalcemia and liver masses.  Pt with suspected metastatic pancreatic cancer    PT Comments    Pt assisted with ambulating in hallway and performed a few LE exercises once positioned in recliner.  Pt fatigues quickly.  Plan is for d/c to SNF for rehab possibly today.  Follow Up Recommendations  SNF     Equipment Recommendations  Rolling walker with 5" wheels;3in1 (PT)    Recommendations for Other Services       Precautions / Restrictions Precautions Precautions: Fall Precaution Comments: monitor sats    Mobility  Bed Mobility Overal bed mobility: Needs Assistance Bed Mobility: Supine to Sit     Supine to sit: Mod assist     General bed mobility comments: assist for trunk upright and scooting to EOB, utilized bed pad  Transfers Overall transfer level: Needs assistance Equipment used: Rolling walker (2 wheeled) Transfers: Sit to/from Stand Sit to Stand: Mod assist;+2 physical assistance         General transfer comment: verbal cues for technique, assist to rise and steady as well as control descent , pt tends to "plop"  Ambulation/Gait Ambulation/Gait assistance: Min assist;+2 safety/equipment Ambulation Distance (Feet): 150 Feet Assistive device: Rolling walker (2 wheeled) Gait Pattern/deviations: Step-through pattern;Trunk flexed     General Gait Details: verbal cues for RW positioning, SpO2 on room air during ambulation 99%   Stairs            Wheelchair Mobility    Modified Rankin (Stroke Patients Only)       Balance                                    Cognition Arousal/Alertness:  Awake/alert Behavior During Therapy: WFL for tasks assessed/performed Overall Cognitive Status: Within Functional Limits for tasks assessed                      Exercises General Exercises - Lower Extremity Ankle Circles/Pumps: AROM;Both;10 reps Long Arc Quad: AROM;Both;10 reps;Seated Hip ABduction/ADduction: AAROM;Supine;Both;10 reps Hip Flexion/Marching: Seated;AROM;Both;10 reps    General Comments        Pertinent Vitals/Pain Pain Assessment: No/denies pain    Home Living                      Prior Function            PT Goals (current goals can now be found in the care plan section) Progress towards PT goals: Progressing toward goals    Frequency  Min 3X/week    PT Plan Current plan remains appropriate    Co-evaluation             End of Session Equipment Utilized During Treatment: Gait belt Activity Tolerance: Patient limited by fatigue Patient left: in chair;with call bell/phone within reach;with family/visitor present;with chair alarm set     Time: 1024-1040 PT Time Calculation (min) (ACUTE ONLY): 16 min  Charges:  $Gait Training: 8-22 mins                    G Codes:      [name] [name],[name] [name] 26-Dec-2015, 11:47 AM [name] [name]  [name] [name], PT, DPT 12/18/2015 Pager: [phone]

## 2015-12-19 ENCOUNTER — Other Ambulatory Visit: Payer: Self-pay | Admitting: Hematology

## 2015-12-19 DIAGNOSIS — C259 Malignant neoplasm of pancreas, unspecified: Secondary | ICD-10-CM

## 2015-12-19 DIAGNOSIS — C787 Secondary malignant neoplasm of liver and intrahepatic bile duct: Principal | ICD-10-CM

## 2015-12-20 ENCOUNTER — Telehealth: Payer: Self-pay | Admitting: Hematology

## 2015-12-20 NOTE — Telephone Encounter (Signed)
lvm for pt dtr regarding to june appt

## 2015-12-25 ENCOUNTER — Encounter: Payer: Self-pay | Admitting: Podiatry

## 2015-12-25 ENCOUNTER — Ambulatory Visit (INDEPENDENT_AMBULATORY_CARE_PROVIDER_SITE_OTHER): Payer: Medicare Other | Admitting: Podiatry

## 2015-12-25 VITALS — BP 122/88 | HR 84 | Resp 18

## 2015-12-25 DIAGNOSIS — M79675 Pain in left toe(s): Secondary | ICD-10-CM | POA: Diagnosis not present

## 2015-12-25 DIAGNOSIS — M79674 Pain in right toe(s): Secondary | ICD-10-CM | POA: Diagnosis not present

## 2015-12-25 DIAGNOSIS — B351 Tinea unguium: Secondary | ICD-10-CM | POA: Diagnosis not present

## 2015-12-25 NOTE — Progress Notes (Addendum)
   Subjective:    Patient ID: [name] [name], male    DOB: [birth year], 82 y.o.   MRN: [id number]  HPI  82 year old male presents the office today for concerns of thick, painful, elongated toenails that he cannot trim himself. He presents to family members. Denies any surrounding redness or drainage. No swelling or redness. No other complaints at this time.   Review of Systems  All other systems reviewed and are negative.      Objective:   Physical Exam General: AAO x3, NAD  Dermatological: Nails are hypertrophic, dystrophic, brittle, discolored, elongated 10. No swelling redness or drainage. Tenderness nails 1-5 bilaterally. No open lesions or pre-ulcerative lesions identified at this time.  Vascular: Dorsalis Pedis artery and Posterior Tibial artery pedal pulses are 2/4 bilateral with immedate capillary fill time. There is no pain with calf compression, swelling, warmth, erythema.   Neruologic: Sensation decreased with Derrel Nip monofilament  Musculoskeletal: HAV present. No pain, crepitus, or limitation noted with foot and ankle range of motion bilateral. Muscular strength 5/5 in all groups tested bilateral.  Gait: Unassisted, Nonantalgic.      Assessment & Plan:  82 year old male with symptomatic onychomycosis -Treatment options discussed including all alternatives, risks, and complications -Etiology of symptoms were discussed -Nails debrided 10 without complications or bleeding -Discussed daily foot inspection -Offloading pads dispensed for the bunion.  -Follow-up in 3 months or sooner if any problems arise. In the meantime, encouraged to call the office with any questions, concerns, change in symptoms.   Celesta Gentile, DPM

## 2016-01-01 ENCOUNTER — Encounter: Payer: Self-pay | Admitting: *Deleted

## 2016-01-01 ENCOUNTER — Encounter: Payer: Self-pay | Admitting: Hematology

## 2016-01-01 ENCOUNTER — Other Ambulatory Visit: Payer: Medicare Other

## 2016-01-01 ENCOUNTER — Other Ambulatory Visit (HOSPITAL_BASED_OUTPATIENT_CLINIC_OR_DEPARTMENT_OTHER): Payer: Medicare Other

## 2016-01-01 ENCOUNTER — Ambulatory Visit (HOSPITAL_BASED_OUTPATIENT_CLINIC_OR_DEPARTMENT_OTHER): Payer: Medicare Other | Admitting: Hematology

## 2016-01-01 VITALS — BP 99/68 | HR 70 | Temp 97.6°F | Resp 16 | Ht 70.0 in

## 2016-01-01 DIAGNOSIS — A047 Enterocolitis due to Clostridium difficile: Secondary | ICD-10-CM

## 2016-01-01 DIAGNOSIS — C259 Malignant neoplasm of pancreas, unspecified: Secondary | ICD-10-CM

## 2016-01-01 DIAGNOSIS — C772 Secondary and unspecified malignant neoplasm of intra-abdominal lymph nodes: Secondary | ICD-10-CM

## 2016-01-01 DIAGNOSIS — C251 Malignant neoplasm of body of pancreas: Secondary | ICD-10-CM | POA: Diagnosis not present

## 2016-01-01 DIAGNOSIS — E860 Dehydration: Secondary | ICD-10-CM

## 2016-01-01 DIAGNOSIS — R627 Adult failure to thrive: Secondary | ICD-10-CM

## 2016-01-01 DIAGNOSIS — N179 Acute kidney failure, unspecified: Secondary | ICD-10-CM

## 2016-01-01 DIAGNOSIS — C787 Secondary malignant neoplasm of liver and intrahepatic bile duct: Secondary | ICD-10-CM | POA: Diagnosis not present

## 2016-01-01 DIAGNOSIS — R0602 Shortness of breath: Secondary | ICD-10-CM | POA: Diagnosis not present

## 2016-01-01 DIAGNOSIS — R531 Weakness: Secondary | ICD-10-CM

## 2016-01-01 DIAGNOSIS — D649 Anemia, unspecified: Secondary | ICD-10-CM

## 2016-01-01 DIAGNOSIS — E876 Hypokalemia: Secondary | ICD-10-CM

## 2016-01-01 LAB — COMPREHENSIVE METABOLIC PANEL
ALBUMIN: 1.7 g/dL — AB (ref 3.5–5.0)
ALK PHOS: 490 U/L — AB (ref 40–150)
ALT: 61 U/L — ABNORMAL HIGH (ref 0–55)
AST: 199 U/L — AB (ref 5–34)
Anion Gap: 14 mEq/L — ABNORMAL HIGH (ref 3–11)
BILIRUBIN TOTAL: 2.69 mg/dL — AB (ref 0.20–1.20)
BUN: 53 mg/dL — AB (ref 7.0–26.0)
CALCIUM: 8 mg/dL — AB (ref 8.4–10.4)
CO2: 18 mEq/L — ABNORMAL LOW (ref 22–29)
Chloride: 104 mEq/L (ref 98–109)
Creatinine: 2.1 mg/dL — ABNORMAL HIGH (ref 0.7–1.3)
EGFR: 33 mL/min/{1.73_m2} — ABNORMAL LOW (ref >90)
GLUCOSE: 224 mg/dL — AB (ref 70–140)
Potassium: 5.4 mEq/L — ABNORMAL HIGH (ref 3.5–5.1)
SODIUM: 136 meq/L (ref 136–145)
TOTAL PROTEIN: 6.2 g/dL — AB (ref 6.4–8.3)

## 2016-01-01 LAB — CBC & DIFF AND RETIC
BASO%: 0.3 % (ref 0.0–2.0)
BASOS ABS: 0 10*3/uL (ref 0.0–0.1)
EOS ABS: 0 10*3/uL (ref 0.0–0.5)
EOS%: 0.1 % (ref 0.0–7.0)
HEMATOCRIT: 25.5 % — AB (ref 38.4–49.9)
HEMOGLOBIN: 8 g/dL — AB (ref 13.0–17.1)
IMMATURE RETIC FRACT: 8.5 % (ref 3.00–10.60)
LYMPH%: 3.9 % — AB (ref 14.0–49.0)
MCH: 28.8 pg (ref 27.2–33.4)
MCHC: 31.5 g/dL — ABNORMAL LOW (ref 32.0–36.0)
MCV: 91.4 fL (ref 79.3–98.0)
MONO#: 1.8 10*3/uL — AB (ref 0.1–0.9)
MONO%: 11.1 % (ref 0.0–14.0)
NEUT%: 84.6 % — ABNORMAL HIGH (ref 39.0–75.0)
NEUTROS ABS: 13.6 10*3/uL — AB (ref 1.5–6.5)
Platelets: 199 10*3/uL (ref 140–400)
RBC: 2.79 10*6/uL — ABNORMAL LOW (ref 4.20–5.82)
RDW: 19 % — ABNORMAL HIGH (ref 11.0–14.6)
RETIC %: 4.02 % — AB (ref 0.80–1.80)
Retic Ct Abs: 112.16 10*3/uL — ABNORMAL HIGH (ref 34.80–93.90)
WBC: 16.1 10*3/uL — AB (ref 4.0–10.3)
lymph#: 0.6 10*3/uL — ABNORMAL LOW (ref 0.9–3.3)

## 2016-01-01 LAB — TECHNOLOGIST REVIEW

## 2016-01-01 NOTE — Progress Notes (Signed)
Referral given to [facility]). Informed they will reach out to family within 24 hours.

## 2016-01-01 NOTE — Progress Notes (Signed)
HEMATOLOGY/ONCOLOGY CLINIC NOTE  Date of Service: 01/01/2016  Patient Care Team: No Pcp Per Patient as PCP - General (General Practice)    CHIEF COMPLAINTS/PURPOSE OF CONSULTATION:      posthospitalization follow-up for newly diagnosed metastatic adenocarcinoma  Off pancreaticobiliary origin.  HISTORY OF PRESENTING ILLNESS:   [name] [name] is a wonderful 82 y.o. male who has been referred to [place] by Dr Coralyn Pear for evaluation and management of newly diagnosed metastatic adenocarcinoma.  His history of hypertension, diabetes, gout and was recently admitted from 12/01/2015-12/04/2015 due to generalized weakness poor oral intake and possible GI bleeding.  Patient had a GI workup with EGD and colonoscopy. Colonoscopy showed cecal polyp which was noted to be a tubular adenoma with no high-grade dysplasia or malignancy. EGD showed no evidence of acute bleeding. Presence of nonbleeding angiodysplasia which was treated with APC. Patient received PRBC transfusions and his hemoglobin on discharge was 9.4. He also received IV fluids in the setting of acute kidney injury .  Patient was discharged home but unfortunately would be admitted on 12/11/2015 with failure to thrive. He was also noted to be dehydrated, hypomagnesemia, hypocalcemic and acute renal failure. He was having diarrhea after his colonoscopy had a stool study for C. difficile which was positive. He is currently on oral vancomycin. Patient was noted to have some liver function abnormalities with a bilirubin of 1.5 and elevated alkaline phosphatase and AST and had an ultrasound of the abdomen which showed multiple indeterminate hypoechoic liver masses measuring up to 6.4 cm in the right liver lobe concerning for liver metastases. Patient subsequently had a CT of the abdomen and pelvis on 12/12/2015 which revealed widespread hepatic metastatic disease as well as periportal and upper abdominal lymphadenopathy and a 2.6 cm pancreatic body mass. Overall  findings were suggestive of metastatic pancreatic cancer.  Tumor markers were sent which showed elevation of the CA-19-9, significant elevation of CEA and normal AFP tumor marker. Colonoscopy had shown no evidence of colon cancer.  Patient had an ultrasound-guided biopsy of his liver lesion the final pathology of which is pending at this time. Preliminary results as per pathology is consistent with adenocarcinoma.   I was consulted today to discuss the current situation with the patient and his family.  Patient notes decreased oral intake and poor appetite. Notes that his diarrhea from his last and so is improved. One of his daughters is at bedside and involved discussion. All the findings thus far were explained in detail including the concern for likely metastatic pancreatic cancer. We initiated discussion of goals of care. Patient's daughter suggested she would like to talk to her other sisters and would like to rediscuss the situation and the final biopsy results are available to help decide the definitive plan of care.   Patient reports that all of his deterioration of health has been over the last few weeks to months. He lives with one of his daughters and prior to his recent failure to thrive was able to ambulate with a cane and was still driving his own car. He understands that he is gotten much weaker. He reports that he has also been more short of breath and has been using oxygen. Also has developed bilateral lower extremity swelling right more than left without significant calf pain and tenderness.    INTERVAL HISTORY  [name] [name] is for his scheduled post hospitalization followup for his newly diagnosed metastatic pancreaticobiliary adenocarcinoma. He is currently at [facility]. He is accompanied by his multiple  daughters. They report that he has been doing progressively downhill as expected and has developed anasarca. He is currently bedbound and severely fatigued due to  his malignancy. Minimal verbalization. Poor oral intake. He is having some shortness of breath and difficulty with swallowing thin liquids. No fevers or chills. No uncontrolled pain at this time. No overt nausea. Daughters report that the patient is being discharged from the skilled nursing facility in a couple of days and that they don't feel he can be at home and that they don't feel that they could provide help at home. We discussed that given his extremely poor performance status he would not be a candidate for any palliative chemotherapy. The patient and his family would all refer to have him enrolled in institutional hospice at [facility]. An urgent consultation was sent to [facility] for consideration of hospice enrollment urgently so the patient could potentially be transferred out of his current SNF/NH to [facility]. No other acute new symptoms at this time. Patient had C. difficile and the hospital and the daughter notes that he is still having diarrhea and is  still on antibiotics.   MEDICAL HISTORY:  Past Medical History  Diagnosis Date  . Hypertension   . Diabetes mellitus without complication ([place])   . Gout   . Hyperlipidemia     SURGICAL HISTORY: Past Surgical History  Procedure Laterality Date  . Esophagogastroduodenoscopy N/A 12/03/2015    Procedure: ESOPHAGOGASTRODUODENOSCOPY (EGD); Surgeon: Milus Banister, MD; Location: [facility]; Service: Endoscopy; Laterality: N/A;  . Colonoscopy N/A 12/04/2015    Procedure: COLONOSCOPY; Surgeon: Milus Banister, MD; Location: [facility]; Service: Endoscopy; Laterality: N/A;    SOCIAL HISTORY: Social History   Social History  . Marital Status: Married    Spouse Name: N/A  . Number of Children: N/A  . Years of Education: N/A   Occupational History  . Not on file.   Social History Main Topics  . Smoking status: Former  Research scientist (life sciences)  . Smokeless tobacco: Never Used  . Alcohol Use: No  . Drug Use: No  . Sexual Activity: No   Other Topics Concern  . Not on file   Social History Narrative   . Current outpatient prescriptions:  .  atorvastatin (LIPITOR) 20 MG tablet, Take 20 mg by mouth daily., Disp: , Rfl:  .  calcium-vitamin D (OSCAL WITH D) 500-200 MG-UNIT tablet, Take 1 tablet by mouth daily with breakfast., Disp: 30 tablet, Rfl: 0 .  carvedilol (COREG) 12.5 MG tablet, Take 1 tablet (12.5 mg total) by mouth 2 (two) times daily with a meal., Disp: 60 tablet, Rfl: 1 .  insulin glargine (LANTUS) 100 UNIT/ML injection, Inject 0.1 mLs (10 Units total) into the skin at bedtime., Disp: 10 mL, Rfl: 11 .  latanoprost (XALATAN) 0.005 % ophthalmic solution, Place 1 drop into both eyes at bedtime., Disp: , Rfl: 1 .  pantoprazole (PROTONIX) 40 MG tablet, Take 1 tablet (40 mg total) by mouth daily., Disp: 30 tablet, Rfl: 1 .  terazosin (HYTRIN) 5 MG capsule, Take 5 mg by mouth at bedtime. , Disp: , Rfl:  .  vancomycin (VANCOCIN) 50 mg/mL oral solution, Take 2.5 mLs (125 mg total) by mouth every 6 (six) hours., Disp: 10 mL, Rfl: 0    REVIEW OF SYSTEMS:   10 Point review of Systems was done is negative except as noted above.  PHYSICAL EXAMINATION: ECOG PERFORMANCE STATUS: 3-4  GENERAL:Elderly gentleman inWheelchair falling asleep and appearing very fatigued,  with generalized anasarca EYES: Sclerae mildly ectatic  OROPHARYNX:no exudate, no erythema and lips, buccal mucosa, and tongue normal  NECK: supple, +JVD, thyroid normal size, non-tender, without nodularity LYMPH: no palpable lymphadenopathy in the cervical, axillary or inguinal LUNGS: clear to auscultation with normal respiratory effort HEART: regular rate & rhythm ABDOMEN: abdomen distendedft, non-tender, normoactive bowel sounds  Musculoskeletal: Bilateral  pedal edema and Generalized anasarca PSYCH: alert & oriented x 2 NEURO:  Moving all 4 extremities  LABORATORY DATA:  I have reviewed the data as listed  .. . CBC Latest Ref Rng 01/01/2016 12/17/2015 12/16/2015  WBC 4.0 - 10.3 10e3/uL 16.1(H) 15.9(H) 15.5(H)  Hemoglobin 13.0 - 17.1 g/dL 8.0(L) 8.5(L) 9.0(L)  Hematocrit 38.4 - 49.9 % 25.5(L) 25.5(L) 26.8(L)  Platelets 140 - 400 10e3/uL 199 166 181   . CMP Latest Ref Rng 01/01/2016 12/17/2015 12/16/2015  Glucose 70 - 140 mg/dl 224(H) 263(H) 245(H)  BUN 7.0 - 26.0 mg/dL 53.0(H) 22(H) 22(H)  Creatinine 0.7 - 1.3 mg/dL 2.1(H) 0.98 1.09  Sodium 136 - 145 mEq/L 136 137 137  Potassium 3.5 - 5.1 mEq/L 5.4(H) 4.7 4.5  Chloride 101 - 111 mmol/L - 106 106  CO2 22 - 29 mEq/L 18(L) 24 22  Calcium 8.4 - 10.4 mg/dL 8.0(L) 7.5(L) 7.1(L)  Total Protein 6.4 - 8.3 g/dL 6.2(L) - -  Total Bilirubin 0.20 - 1.20 mg/dL 2.69(H) - -  Alkaline Phos 40 - 150 U/L 490(H) - -  AST 5 - 34 U/L 199(HH) - -  ALT 0 - 55 U/L 61(H) - -            .  Component  Latest Ref Rng 12/13/2015  Hepatitis B Surface Ag  Negative Negative  HCV Ab  0.0 - 0.9 s/co ratio 0.1  Hep A Ab, IgM  Negative Negative  Hep B Core Ab, IgM  Negative Negative  TSH  0.350 - 4.500 uIU/mL 4.363  HIV  Non Reactive Non Reactive   Component  Latest Ref Rng 12/13/2015  AFP Tumor Marker  0.0 - 8.3 ng/mL 3.2  CEA  0.0 - 4.7 ng/mL 5823.0 (H)  PSA  0.00 - 4.00 ng/mL 8.31 (H)  CA 19-9  0 - 35 U/mL 106 (H)        Colonoscopy 12/04/2015  - One 17 mm polyp in the cecum, removed with a hot snare. Resected and retrieved. This may habor advanced neoplasia given slightly irregular appearance at its tip. - Diverticulosis in the left colon. - The examination was otherwise normal on direct and retroflexion views.  EGD 11/25/2015 - Normal esophagus. - Gastritis. Biopsied. - One non-bleeding angiodysplastic lesion in the stomach. Treated with argon plasma coagulation (APC). - Normal examined  duodenum.    RADIOGRAPHIC STUDIES: I have personally reviewed the radiological images as listed and agreed with the findings in the report.  Imaging Results    Ct Abdomen Pelvis Wo Contrast  12/12/2015 CLINICAL DATA: 82 year old male with abnormal LFTs. Multiple liver masses detected on recent ultrasound. Initial encounter. EXAM: CT ABDOMEN AND PELVIS WITHOUT CONTRAST TECHNIQUE: Multidetector CT imaging of the abdomen and pelvis was performed following the standard protocol without IV contrast. COMPARISON: Abdomen ultrasound 12/12/2015. FINDINGS: Small bilateral layering pleural effusions. Right lung base atelectasis. No suspicious lung base nodule or mass identified. Bulky endplate degenerative spurring in the lower thoracic spine. Advanced lower lumbar disc and facet degeneration. No acute or suspicious osseous lesion identified. Mild nonspecific presacral stranding. The prostate appears enlarged and heterogeneous (note heterogeneity at the  superior aspect of the prostate on series 2, image 79). The gland encompasses 6-7 cm diameter. The urinary bladder is largely decompressed. There is a large 4.4 cm oval lamellated bladder stone on the right (series 2, image 77). There are superimposed small calcified pelvic phleboliths. There is mild rectal wall thickening. The rectum is mildly distended with low-density stool. The distal sigmoid is decompressed. The proximal sigmoid is redundant and mildly gas distended. There is moderate diverticulosis at the junction of the sigmoid and descending colon segments. There is a small volume of free fluid in the left gutter. The left colon otherwise appears negative. Mild motion artifact at the transverse colon which appears negative aside from retained stool. Mild to moderate diverticulosis of the right colon. Negative appendix. Oral contrast has almost reached the terminal ileum. No dilated small bowel. Oral contrast distends the stomach which otherwise appears normal.  No definite duodenal abnormality. Small volume of free fluid in the right gutter and at the inferior right liver tip, greater than the volume of fluid on the left. Enlarged and heterogeneous liver containing numerous hypodense masses, which coalesce 2 lesions up to 8 cm diameter. All liver lobes appear affected. Small volume perihepatic fluid anteriorly and at the dome. The gallbladder appears contracted with wall thickening and pericholecystic fluid, but no significant inflammatory stranding emanating from the gallbladder fossa. Noncontrast spleen is within normal limits. There is evidence of a 2.5 cm hypodense mass at the body of the pancreas as seen on series 2, image 36. No pancreatic ductal dilatation is evident. The head and uncinate process have a more normal appearance, although there is associated peripancreatic lymphadenopathy including up to 2.8 cm abnormal nodes or ex nodal disease at the portal caval station (series 2, image 35). Mild thickening of the adrenal glands which otherwise have a negative noncontrast appearance. Nonspecific mild to moderate perinephric stranding. There is a a oval mildly lobulated 16 mm kidney stone at the left lower pole with no associated left hydronephrosis. No definite hydroureter. Tortuous distal abdominal aorta with calcified atherosclerosis. Tortuous proximal iliac arteries. Vascular patency not evaluated in the absence of IV contrast. IMPRESSION: 1. Noncontrast study demonstrating evidence of widespread hepatic metastatic disease. Associated porta hepatis and upper abdominal lymphadenopathy. Small volume abdominal free fluid. Small bilateral pleural effusions. 2. Evidence of a 2.6 cm hypodense pancreatic body mass, such that metastatic pancreatic tumor is the favored unifying diagnosis at this time. 3. However, the above findings would be much better characterized on an IV contrast-enhanced study. In the inpatient setting Pancreas protocol CT of the Abdomen with IV  contrast may be most appropriate. 4. Enlarged and heterogeneous prostate also noted, such that the main differential consideration may be metastatic prostate cancer. However, there is no evidence of metastatic disease to the skeleton. 5. Incidentally noted large bladder calculus and left lower pole nonobstructing renal calculus. 6. Calcified aortic atherosclerosis. Electronically Signed By: Genevie Ann M.D. On: 12/12/2015 15:00   Dg Chest 1 View  12/14/2015 CLINICAL DATA: Hypertension. Diabetes EXAM: CHEST 1 VIEW COMPARISON: 12/11/2015 FINDINGS: Normal heart size. Decreased lung volumes. No pleural effusion or edema. No airspace consolidation. IMPRESSION: 1. Decreased lung volumes. 2. No acute findings. Electronically Signed By: Kerby Moors M.D. On: 12/14/2015 12:13   Dg Chest 2 View  12/11/2015 CLINICAL DATA: 82 year old male with recent increase in weakness. Cough, loss of appetite. EXAM: CHEST 2 VIEW COMPARISON: Chest x-ray 12/01/2015. FINDINGS: Mild elevation of the right hemidiaphragm (chronic and unchanged). Lung volumes are normal. No consolidative  airspace disease. No pleural effusions. No pneumothorax. No pulmonary nodule or mass noted. Pulmonary vasculature and the cardiomediastinal silhouette are within normal limits. Atherosclerosis in the thoracic aorta. IMPRESSION: 1. No radiographic evidence of acute cardiopulmonary disease. Electronically Signed By: Vinnie Langton M.D. On: 12/11/2015 08:39   Dg Chest 2 View  12/01/2015 CLINICAL DATA: Acute onset of bilateral leg weakness. Initial encounter. EXAM: CHEST 2 VIEW COMPARISON: Chest radiograph performed 09/12/2013 FINDINGS: The lungs are mildly hypoexpanded. There is mild elevation of the right hemidiaphragm. There is no evidence of focal opacification, pleural effusion or pneumothorax. The heart is borderline normal in size. No acute osseous abnormalities are seen. IMPRESSION: Lungs mildly hypoexpanded but grossly clear,  with mild elevation of the right hemidiaphragm. Electronically Signed By: Garald Balding M.D. On: 12/01/2015 21:31   Ct Head Wo Contrast  12/01/2015 CLINICAL DATA: Weakness. Dizziness for several days. EXAM: CT HEAD WITHOUT CONTRAST TECHNIQUE: Contiguous axial images were obtained from the base of the skull through the vertex without intravenous contrast. COMPARISON: Head CT 09/12/2013 FINDINGS: No intracranial hemorrhage, mass effect, or midline shift. The degree of atrophy and chronic small vessel ischemia is unchanged from prior exam. No hydrocephalus. The basilar cisterns are patent. No evidence of territorial infarct. No intracranial fluid collection. Atherosclerosis of skullbase vasculature. Calvarium is intact. Included paranasal sinuses and mastoid air cells are well aerated. Improved mucosal thickening of the ethmoid air cells and maxillary sinuses. IMPRESSION: 1. No acute intracranial abnormality. 2. Atrophy and chronic small vessel ischemia, unchanged from prior exam. Electronically Signed By: Jeb Levering M.D. On: 12/01/2015 21:40   US Renal  12/12/2015 CLINICAL DATA: Increased BUN and creatinine, edema EXAM: RENAL / URINARY TRACT ULTRASOUND COMPLETE COMPARISON: None. FINDINGS: Right Kidney: Length: 11 cm. Echogenicity within normal limits. No mass or hydronephrosis visualized. Left Kidney: Length: 11.4 cm. Normal echogenicity with no hydronephrosis. 2 x 2 x 2.2 cm hypoechoic mass interpolar region left kidney. 15 mm echogenic focus with shadowing in the lower pole suggesting stone. Bladder: 4.2 cm echogenic focus within the bladder causing posterior acoustic shadowing possibly representing a large calculus. Prostate is enlarged at 65 x 64 x 67 mm. IMPRESSION: 1. Indeterminate left renal mass versus cyst. If the patient cannot undergo contrast-enhanced CT scan consider renal protocol MRI. 2. Nonobstructing 15 mm left renal stone 3. 4 cm bladder calculus. Enlargement of the  prostate. Electronically Signed By: Skipper Cliche M.D. On: 12/12/2015 08:57   US Biopsy  12/13/2015 INDICATION: Liver lesions EXAM: ULTRASOUND-GUIDED BIOPSY OF A LIVER LESION. CORE. MEDICATIONS: None. ANESTHESIA/SEDATION: Fentanyl 25 mcg IV; Versed 1 mg IV Moderate Sedation Time: 14 The patient was continuously monitored during the procedure by the interventional radiology nurse under my direct supervision. FLUOROSCOPY TIME: Fluoroscopy Time: minutes seconds ( mGy). COMPLICATIONS: None immediate. PROCEDURE: Informed written consent was obtained from the patient after a thorough discussion of the procedural risks, benefits and alternatives. All questions were addressed. Maximal Sterile Barrier Technique was utilized including caps, mask, sterile gowns, sterile gloves, sterile drape, hand hygiene and skin antiseptic. A timeout was performed prior to the initiation of the procedure. The right flank was prepped with ChloraPrep in a sterile fashion, and a sterile drape was applied covering the operative field. A sterile gown and sterile gloves were used for the procedure. Under sonographic guidance, an 71 gauge guide needle was advanced into the right lobe of the liver, within a lesion. Subsequently 2 18 gauge core biopsies were obtained. Gel-Foam slurry was injected into the tract. The guide needle was  removed. Final imaging was performed. Patient tolerated the procedure well without complication. Vital sign monitoring by nursing staff during the procedure will continue as patient is in the special procedures unit for post procedure observation. FINDINGS: The images document guide needle placement within the right lobe liver lesion. Post biopsy images demonstrate no hemorrhage. IMPRESSION: Successful ultrasound-guided core biopsy of a right lobe liver lesion. Electronically Signed By: Marybelle Killings M.D. On: 12/13/2015 13:09   Dg Foot 2 Views Left  11/30/2015 CLINICAL DATA: Swollen, painful left  great toe EXAM: LEFT FOOT - 2 VIEW COMPARISON: None. FINDINGS: Tarsal-metatarsal alignment is normal. No fracture is seen. No definite erosion is noted. Joint spaces are relatively well preserved for age. There is faint opacity within the soft tissues adjacent to the left first metatarsal head which could represent faint calcification possibly indicative of gout. There is pes planus present. There are degenerative changes in the midfoot with degenerative calcaneal spurs also noted. IMPRESSION: 1. No acute abnormality. 2. Questionable faint calcification in the soft tissues adjacent to the left first MTP joint could indicate gout but no erosions are noted. 3. Pes planus with degenerative changes in the mid and hindfoot. Electronically Signed By: Ivar Drape M.D. On: 11/30/2015 15:12   US Abdomen Limited Ruq  12/12/2015 CLINICAL DATA: 82 year old male inpatient with elevated liver function tests. EXAM: US ABDOMEN LIMITED - RIGHT UPPER QUADRANT COMPARISON: None. FINDINGS: Gallbladder: Contracted gallbladder, limiting gallbladder evaluation. No definite gallbladder wall thickening accounting for the contracted state. No gallstones, pericholecystic fluid or sonographic Murphy sign. Common bile duct: Diameter: 2 mm Liver: There are multiple (greater than 5) hypoechoic liver masses scattered throughout the liver measuring up to 6.3 x 5.7 x 6.4 cm in the right liver lobe. The background liver parenchymal echotexture and echogenicity are normal. Trace perihepatic ascites. IMPRESSION: 1. Multiple indeterminate hypoechoic liver masses scattered throughout the liver measuring up to 6.4 cm in the right liver lobe, worrisome for liver metastases. Further evaluation with CT abdomen/pelvis with IV and oral contrast is recommended. 2. Trace perihepatic ascites. 3. Contracted gallbladder with no gallstones. Electronically Signed By: Ilona Sorrel M.D. On: 12/12/2015 09:00     ASSESSMENT & PLAN:   82 year old male  with  #1 Newly diagnosed metastatic adenocarcinoma with pancreatic body mass and multiple liver metastases as well as metastasis to the upper abdominal and periportal lymph nodes. Overall picture consistent with metastatic pancreatic/pancreaticobiliary adenocarcinoma.  Final pathology results pending Elevation of CA-19-9 and CEA levels. Patient with currently poor performance status with ECOG PS of 3-4 Plan -Patient's poor performance status and increasing symptom burdens makes him a poor candidate for any palliative chemotherapy. -Patient and his daughters are agreeable with hospice enrollment. -We shall send an urgent referral to [place] and palliative care for hospice involvement. Patient and family prefer to move to [facility] once they are discharge from his current SNF/NH at [place].  #2 shortness of breath with new requirement of oxygen - ruled out pulmonary embolism with VQ scan . Ultrasound extremities apparently negative for DVT. Plan Oxygen as needed for comfort. -Will likely need scheduled opiates to help with management of worsening dyspnea.  #3 C. difficile colitis with diarrhea -Complete course of oral vancomycin and place on ongoing maintenance rx  #4 anemia likely anemia of chronic disease related to his metastatic malignancy. Gradually worsening .uncertain that transfusion at this time will help his quality of life .patient is already significantly fluid overloaded .  #5 . Failure to thrive with dehydration  multiple electrolyte abnormalities poor appetite. Labs today show hyperkalemia, acute renal insufficiency due to poor oral intake .   continue forward with hospice enrollment for continued comfort cares . His symptom management and functional requirements are too high to be easily managed at home and he would likely benefit from institution of hospice .  All of the patients and his families questions were answered to their apparent  satisfaction. The patient knows to call the clinic with any problems, questions or concerns.  I spent 45 minutes counseling the patient face to face. The total time spent in the appointment was 45 minutes and more than 50% was on counseling and direct patient cares.   Sullivan Lone MD [place] AAHIVMS [facility] Hematology/Oncology Physician [facility]  (Office): [phone] (Work cell): [phone] (Fax): [phone]

## 2016-01-02 ENCOUNTER — Encounter (HOSPITAL_COMMUNITY): Payer: Self-pay | Admitting: Emergency Medicine

## 2016-01-02 ENCOUNTER — Emergency Department (HOSPITAL_COMMUNITY): Payer: Medicare Other

## 2016-01-02 ENCOUNTER — Observation Stay (HOSPITAL_COMMUNITY)
Admission: EM | Admit: 2016-01-02 | Discharge: 2016-01-02 | Disposition: A | Discharge status: Home / Self Care | Payer: Medicare Other | Attending: Internal Medicine | Admitting: Internal Medicine

## 2016-01-02 ENCOUNTER — Other Ambulatory Visit: Payer: Self-pay

## 2016-01-02 DIAGNOSIS — E119 Type 2 diabetes mellitus without complications: Secondary | ICD-10-CM | POA: Insufficient documentation

## 2016-01-02 DIAGNOSIS — Z515 Encounter for palliative care: Secondary | ICD-10-CM | POA: Diagnosis not present

## 2016-01-02 DIAGNOSIS — R69 Illness, unspecified: Secondary | ICD-10-CM | POA: Diagnosis not present

## 2016-01-02 DIAGNOSIS — IMO0002 Reserved for concepts with insufficient information to code with codable children: Secondary | ICD-10-CM | POA: Diagnosis present

## 2016-01-02 DIAGNOSIS — Z87891 Personal history of nicotine dependence: Secondary | ICD-10-CM | POA: Insufficient documentation

## 2016-01-02 DIAGNOSIS — C259 Malignant neoplasm of pancreas, unspecified: Secondary | ICD-10-CM | POA: Diagnosis present

## 2016-01-02 DIAGNOSIS — A0472 Enterocolitis due to Clostridium difficile, not specified as recurrent: Secondary | ICD-10-CM | POA: Diagnosis present

## 2016-01-02 DIAGNOSIS — I10 Essential (primary) hypertension: Secondary | ICD-10-CM | POA: Insufficient documentation

## 2016-01-02 DIAGNOSIS — R4182 Altered mental status, unspecified: Secondary | ICD-10-CM | POA: Insufficient documentation

## 2016-01-02 DIAGNOSIS — A419 Sepsis, unspecified organism: Secondary | ICD-10-CM

## 2016-01-02 DIAGNOSIS — J80 Acute respiratory distress syndrome: Principal | ICD-10-CM | POA: Insufficient documentation

## 2016-01-02 DIAGNOSIS — R0603 Acute respiratory distress: Secondary | ICD-10-CM

## 2016-01-02 DIAGNOSIS — A047 Enterocolitis due to Clostridium difficile: Secondary | ICD-10-CM

## 2016-01-02 DIAGNOSIS — Z794 Long term (current) use of insulin: Secondary | ICD-10-CM | POA: Insufficient documentation

## 2016-01-02 DIAGNOSIS — R6520 Severe sepsis without septic shock: Secondary | ICD-10-CM

## 2016-01-02 DIAGNOSIS — C787 Secondary malignant neoplasm of liver and intrahepatic bile duct: Secondary | ICD-10-CM

## 2016-01-02 HISTORY — DX: Sepsis, unspecified organism: A41.9

## 2016-01-02 HISTORY — DX: Malignant neoplasm of pancreas, unspecified: C25.9

## 2016-01-02 HISTORY — DX: Secondary malignant neoplasm of unspecified site: C79.9

## 2016-01-02 HISTORY — DX: Severe sepsis without septic shock: R65.20

## 2016-01-02 LAB — GLUCOSE, CAPILLARY: Glucose-Capillary: 168 mg/dL — ABNORMAL HIGH (ref 65–99)

## 2016-01-02 LAB — I-STAT CG4 LACTIC ACID, ED: LACTIC ACID, VENOUS: 4.68 mmol/L — AB (ref 0.5–1.9)

## 2016-01-02 LAB — I-STAT TROPONIN, ED: TROPONIN I, POC: 0 ng/mL (ref 0.00–0.08)

## 2016-01-02 MED ORDER — ACETAMINOPHEN 650 MG RE SUPP: 650.0000 mg | Freq: Four times a day (QID) | RECTAL | Status: DC | PRN

## 2016-01-02 MED ORDER — MORPHINE SULFATE (PF) 2 MG/ML IV SOLN
1.0000 mg | INTRAVENOUS | Status: DC | PRN
  Administered 2016-01-02: 2 mg via INTRAVENOUS
  Filled 2016-01-02: qty 1

## 2016-01-02 MED ORDER — GLYCOPYRROLATE 0.2 MG/ML IJ SOLN
0.2000 mg | INTRAMUSCULAR | Status: DC | PRN
  Filled 2016-01-02: qty 1

## 2016-01-02 MED ORDER — POLYVINYL ALCOHOL 1.4 % OP SOLN
1.0000 [drp] | Freq: Four times a day (QID) | OPHTHALMIC | Status: DC | PRN
Start: 2016-01-02 — End: 2016-01-02
  Filled 2016-01-02: qty 15

## 2016-01-02 MED ORDER — HALOPERIDOL LACTATE 5 MG/ML IJ SOLN: 0.5000 mg | INTRAMUSCULAR | Status: DC | PRN

## 2016-01-02 MED ORDER — GLYCOPYRROLATE 1 MG PO TABS
1.0000 mg | ORAL_TABLET | ORAL | Status: DC | PRN
  Filled 2016-01-02: qty 1

## 2016-01-02 MED ORDER — ONDANSETRON HCL 4 MG/2ML IJ SOLN: 4.0000 mg | Freq: Four times a day (QID) | INTRAMUSCULAR | Status: DC | PRN

## 2016-01-02 MED ORDER — BIOTENE DRY MOUTH MT LIQD: 15.0000 mL | OROMUCOSAL | Status: DC | PRN

## 2016-01-02 MED ORDER — ACETAMINOPHEN 325 MG PO TABS
650.0000 mg | ORAL_TABLET | Freq: Four times a day (QID) | ORAL | Status: DC | PRN
Start: 2016-01-02 — End: 2016-01-02

## 2016-01-02 MED ORDER — MORPHINE SULFATE (PF) 2 MG/ML IV SOLN
2.0000 mg | Freq: Once | INTRAVENOUS | Status: AC
  Administered 2016-01-02: 2 mg via INTRAVENOUS
  Filled 2016-01-02: qty 1

## 2016-01-02 MED ORDER — HALOPERIDOL LACTATE 2 MG/ML PO CONC
0.5000 mg | ORAL | Status: DC | PRN
  Filled 2016-01-02: qty 0.3

## 2016-01-02 MED ORDER — CETYLPYRIDINIUM CHLORIDE 0.05 % MT LIQD
7.0000 mL | Freq: Two times a day (BID) | OROMUCOSAL | Status: DC
  Administered 2016-01-02: 7 mL via OROMUCOSAL

## 2016-01-02 MED ORDER — HALOPERIDOL 0.5 MG PO TABS
0.5000 mg | ORAL_TABLET | ORAL | Status: DC | PRN
  Filled 2016-01-02: qty 1

## 2016-01-02 MED ORDER — ONDANSETRON 4 MG PO TBDP: 4.0000 mg | ORAL_TABLET | Freq: Four times a day (QID) | ORAL | Status: DC | PRN

## 2016-01-02 MED ORDER — SODIUM CHLORIDE 0.9 % IV BOLUS (SEPSIS)
1000.0000 mL | Freq: Once | INTRAVENOUS | Status: DC
  Administered 2016-01-02: 1000 mL via INTRAVENOUS

## 2016-01-02 NOTE — Discharge Summary (Signed)
[facility] Discharge Summary   Patient: [name] [name]   PCP: No PCP Per Patient DOB: 05/24/1929   Date of admission: 01/02/2016   Date of discharge:  01/02/2016    Discharge Diagnoses:  Principal Problem:   End of life care Active Problems:   C. difficile colitis   Pancreatic cancer metastasized to liver ([place])   MODS (multiple organ dysfunction syndrome)   Severe sepsis with acute organ dysfunction [facility])  Admitted From: SNF Disposition:  Inpatient hospice  Recommendations for Outpatient Follow-up:  1. Please adjust medication for symptoms as and when needed   Diet recommendation: Comfort FEED  Activity: The patient is advised to gradually reintroduce usual activities.  Discharge Condition: poor  Code Status: DNR/DNI, transferring to inpatient hospice facility  History of present illness: As per the H and P dictated on admission, "[name] [name] is a 82 y.o. male with medical history significant of metastatic pancreatic cancer with liver mets diagnosed 3 weeks ago, C.Diff. Patient had actually just had end of life conversation with his oncologist yesterday and had elected to forgo further treatment and proceed with hospice. Last evening patient became increasingly SOB and altered and was brought in to the ED.  ED Course: After discussion by EDP with family members, they confirm his DNR/DNI code status and wish to proceed to comfort measures only. They request no labs, imaging, etc be done at this time. Goal of care is to get him to an inpatient hospice facility if he survives long enough and they have a bed."  Hospital Course:  Summary of his active problems in the hospital is as following. Principal Problem:   End of life care Active Problems:   C. difficile colitis   Pancreatic cancer metastasized to liver ([place])   MODS (multiple organ dysfunction syndrome)   Severe sepsis with acute organ dysfunction [facility])  Patient has been diagnosed with  metastatic pancreatic biliary adenocarcinoma. He was recently hospitalized and discharged at [facility] SNF. Due to poor oral intake and failure to thrive the family decided to transition patient's care to hospice. Patient was progressively worsening and at that point it was decided to bring the patient to ER for urgent symptom management. Patient had a bed availability at the [facility] inpatient hospice at the time of discharge and the patient's care will be transitioned to that facility. Medication dose will be adjusted at the facility.  Patient had history of recent C. difficile and should have completed his antibiotic course on 12/28/2015 for vancomycin.  There is a concern that the patient is having ongoing sepsis with acute kidney injury, lactic acidosis, hyperbilirubinemia, hyperkalemia. His peripheral smear yesterday was also showing evidence of Schistocyte is concerning for possible ongoing hemolytic process in the setting of sepsis as well as metastatic cancer.  Procedures and Results:  none   Consultations:  none  DISCHARGE MEDICATION: Current Discharge Medication List    CONTINUE these medications which have NOT CHANGED   Details  latanoprost (XALATAN) 0.005 % ophthalmic solution Place 1 drop into both eyes at bedtime. Refills: 1    terazosin (HYTRIN) 5 MG capsule Take 5 mg by mouth at bedtime.     vancomycin (VANCOCIN) 50 mg/mL oral solution Take 2.5 mLs (125 mg total) by mouth every 6 (six) hours. Qty: 10 mL, Refills: 0      STOP taking these medications     atorvastatin (LIPITOR) 20 MG tablet      calcium-vitamin D (OSCAL WITH D) 500-200  MG-UNIT tablet      carvedilol (COREG) 12.5 MG tablet      insulin glargine (LANTUS) 100 UNIT/ML injection      pantoprazole (PROTONIX) 40 MG tablet        Allergies  Allergen Reactions  . Ace Inhibitors     Listed on allergy list in wallet; unk reason per pt  . Capoten [Captopril]     Listed on allergy  list in wallet; unk reason per pt  . Glucophage [Metformin]     Listed on pt's med card under allergies, but pt currently on metformin  . Vasotec [Enalapril]     Listed on allergy list in pt's wallet; unk reason per pt    Discharge Exam: There were no vitals filed for this visit. Filed Vitals:   01/02/16 0625 01/02/16 0943  BP: 104/62 103/59  Pulse: 66 61  Temp: 98 F (36.7 C) 97.3 F (36.3 C)  Resp: 22 18   General: Appear in moderate distress, no Rash; Oral Mucosa moist. Cardiovascular: S1 and S2 Present, no Murmur, no JVD Respiratory: Bilateral Air entry present and Clear to Auscultation, no Crackles, no wheezes Abdomen: Bowel Sound present, Soft and no tenderness Extremities: no Pedal edema, no calf tenderness Neurology: Grossly no focal neuro deficit.  The results of significant diagnostics from this hospitalization (including imaging, microbiology, ancillary and laboratory) are listed below for reference.    Significant Diagnostic Studies: Ct Abdomen Pelvis Wo Contrast  12/12/2015  CLINICAL DATA:  82 year old male with abnormal LFTs. Multiple liver masses detected on recent ultrasound. Initial encounter. EXAM: CT ABDOMEN AND PELVIS WITHOUT CONTRAST TECHNIQUE: Multidetector CT imaging of the abdomen and pelvis was performed following the standard protocol without IV contrast. COMPARISON:  Abdomen ultrasound 12/12/2015. FINDINGS: Small bilateral layering pleural effusions. Right lung base atelectasis. No suspicious lung base nodule or mass identified. Bulky endplate degenerative spurring in the lower thoracic spine. Advanced lower lumbar disc and facet degeneration. No acute or suspicious osseous lesion identified. Mild nonspecific presacral stranding. The prostate appears enlarged and heterogeneous (note heterogeneity at the superior aspect of the prostate on series 2, image 79). The gland encompasses 6-7 cm diameter. The urinary bladder is largely decompressed. There is a large 4.4  cm oval lamellated bladder stone on the right (series 2, image 77). There are superimposed small calcified pelvic phleboliths. There is mild rectal wall thickening. The rectum is mildly distended with low-density stool. The distal sigmoid is decompressed. The proximal sigmoid is redundant and mildly gas distended. There is moderate diverticulosis at the junction of the sigmoid and descending colon segments. There is a small volume of free fluid in the left gutter. The left colon otherwise appears negative. Mild motion artifact at the transverse colon which appears negative aside from retained stool. Mild to moderate diverticulosis of the right colon. Negative appendix. Oral contrast has almost reached the terminal ileum. No dilated small bowel. Oral contrast distends the stomach which otherwise appears normal. No definite duodenal abnormality. Small volume of free fluid in the right gutter and at the inferior right liver tip, greater than the volume of fluid on the left. Enlarged and heterogeneous liver containing numerous hypodense masses, which coalesce 2 lesions up to 8 cm diameter. All liver lobes appear affected. Small volume perihepatic fluid anteriorly and at the dome. The gallbladder appears contracted with wall thickening and pericholecystic fluid, but no significant inflammatory stranding emanating from the gallbladder fossa. Noncontrast spleen is within normal limits. There is evidence of a 2.5 cm hypodense  mass at the body of the pancreas as seen on series 2, image 36. No pancreatic ductal dilatation is evident. The head and uncinate process have a more normal appearance, although there is associated peripancreatic lymphadenopathy including up to 2.8 cm abnormal nodes or ex nodal disease at the portal caval station (series 2, image 35). Mild thickening of the adrenal glands which otherwise have a negative noncontrast appearance. Nonspecific mild to moderate perinephric stranding. There is a a oval mildly  lobulated 16 mm kidney stone at the left lower pole with no associated left hydronephrosis. No definite hydroureter. Tortuous distal abdominal aorta with calcified atherosclerosis. Tortuous proximal iliac arteries. Vascular patency not evaluated in the absence of IV contrast. IMPRESSION: 1. Noncontrast study demonstrating evidence of widespread hepatic metastatic disease. Associated porta hepatis and upper abdominal lymphadenopathy. Small volume abdominal free fluid. Small bilateral pleural effusions. 2. Evidence of a 2.6 cm hypodense pancreatic body mass, such that metastatic pancreatic tumor is the favored unifying diagnosis at this time. 3. However, the above findings would be much better characterized on an IV contrast-enhanced study. In the inpatient setting Pancreas protocol CT of the Abdomen with IV contrast may be most appropriate. 4. Enlarged and heterogeneous prostate also noted, such that the main differential consideration may be metastatic prostate cancer. However, there is no evidence of metastatic disease to the skeleton. 5. Incidentally noted large bladder calculus and left lower pole nonobstructing renal calculus. 6. Calcified aortic atherosclerosis. Electronically Signed   By: Genevie Ann M.D.   On: 12/12/2015 15:00   Dg Chest 1 View  12/14/2015  CLINICAL DATA:  Hypertension.  Diabetes EXAM: CHEST 1 VIEW COMPARISON:  12/11/2015 FINDINGS: Normal heart size. Decreased lung volumes. No pleural effusion or edema. No airspace consolidation. IMPRESSION: 1. Decreased lung volumes. 2. No acute findings. Electronically Signed   By: Kerby Moors M.D.   On: 12/14/2015 12:13   Dg Chest 2 View  12/16/2015  CLINICAL DATA:  82 year old male with dyspnea EXAM: CHEST  2 VIEW COMPARISON:  None. FINDINGS: There is a focal area of slight increased density in the apical segment of the left lower lobe seen on the lateral projection, likely artifactual and go related to superimposition of the structures. Pneumonia is  less likely. Clinical correlation is recommended. The lungs are otherwise clear. There is no pleural effusion or pneumothorax. The cardiac silhouette is within normal limits. No acute osseous pathology identified. IMPRESSION: Artifact versus less likely focal density in the superior segment of the left lower lobe seen on the lateral projection. Clinical correlation is recommended. Electronically Signed   By: Anner Crete M.D.   On: 12/16/2015 01:56   Dg Chest 2 View  12/11/2015  CLINICAL DATA:  82 year old male with recent increase in weakness. Cough, loss of appetite. EXAM: CHEST  2 VIEW COMPARISON:  Chest x-ray 12/01/2015. FINDINGS: Mild elevation of the right hemidiaphragm (chronic and unchanged). Lung volumes are normal. No consolidative airspace disease. No pleural effusions. No pneumothorax. No pulmonary nodule or mass noted. Pulmonary vasculature and the cardiomediastinal silhouette are within normal limits. Atherosclerosis in the thoracic aorta. IMPRESSION: 1. No radiographic evidence of acute cardiopulmonary disease. Electronically Signed   By: Vinnie Langton M.D.   On: 12/11/2015 08:39   US Renal  12/12/2015  CLINICAL DATA:  Increased BUN and creatinine, edema EXAM: RENAL / URINARY TRACT ULTRASOUND COMPLETE COMPARISON:  None. FINDINGS: Right Kidney: Length: 11 cm. Echogenicity within normal limits. No mass or hydronephrosis visualized. Left Kidney: Length: 11.4 cm. Normal echogenicity  with no hydronephrosis. 2 x 2 x 2.2 cm hypoechoic mass interpolar region left kidney. 15 mm echogenic focus with shadowing in the lower pole suggesting stone. Bladder: 4.2 cm echogenic focus within the bladder causing posterior acoustic shadowing possibly representing a large calculus. Prostate is enlarged at 65 x 64 x 67 mm. IMPRESSION: 1. Indeterminate left renal mass versus cyst. If the patient cannot undergo contrast-enhanced CT scan consider renal protocol MRI. 2. Nonobstructing 15 mm left renal stone 3. 4 cm  bladder calculus.  Enlargement of the prostate. Electronically Signed   By: Skipper Cliche M.D.   On: 12/12/2015 08:57   Nm Pulmonary Perf And Vent  12/16/2015  CLINICAL DATA:  82 year old male with shortness of breath EXAM: NUCLEAR MEDICINE VENTILATION - PERFUSION LUNG SCAN TECHNIQUE: Ventilation images were obtained in multiple projections using inhaled aerosol Tc-54m DTPA. Perfusion images were obtained in multiple projections after intravenous injection of Tc-68m MAA. RADIOPHARMACEUTICALS:  32.6 mCi Technetium-74m DTPA aerosol inhalation and 4.19 mCi Technetium-52m MAA IV COMPARISON:  Chest radiograph dated 12/16/2015 FINDINGS: Ventilation: There is deposition of the radiotracer within the central airways compatible with chronic obstructive lung. There is heterogeneous uptake of the radiopharmaceutical. Perfusion: There is overall better perfusion of the lungs compare to the ventilation images. No wedge shaped peripheral perfusion defects to suggest acute pulmonary embolism. IMPRESSION: Low probability for pulmonary embolism. Electronically Signed   By: Anner Crete M.D.   On: 12/16/2015 01:34   US Biopsy  12/13/2015  INDICATION: Liver lesions EXAM: ULTRASOUND-GUIDED BIOPSY OF A LIVER LESION.  CORE. MEDICATIONS: None. ANESTHESIA/SEDATION: Fentanyl 25 mcg IV; Versed 1 mg IV Moderate Sedation Time:  14 The patient was continuously monitored during the procedure by the interventional radiology nurse under my direct supervision. FLUOROSCOPY TIME:  Fluoroscopy Time:  minutes  seconds ( mGy). COMPLICATIONS: None immediate. PROCEDURE: Informed written consent was obtained from the patient after a thorough discussion of the procedural risks, benefits and alternatives. All questions were addressed. Maximal Sterile Barrier Technique was utilized including caps, mask, sterile gowns, sterile gloves, sterile drape, hand hygiene and skin antiseptic. A timeout was performed prior to the initiation of the procedure.  The right flank was prepped with ChloraPrep in a sterile fashion, and a sterile drape was applied covering the operative field. A sterile gown and sterile gloves were used for the procedure. Under sonographic guidance, an 60 gauge guide needle was advanced into the right lobe of the liver, within a lesion. Subsequently 2 18 gauge core biopsies were obtained. Gel-Foam slurry was injected into the tract. The guide needle was removed. Final imaging was performed. Patient tolerated the procedure well without complication. Vital sign monitoring by nursing staff during the procedure will continue as patient is in the special procedures unit for post procedure observation. FINDINGS: The images document guide needle placement within the right lobe liver lesion. Post biopsy images demonstrate no hemorrhage. IMPRESSION: Successful ultrasound-guided core biopsy of a right lobe liver lesion. Electronically Signed   By: Marybelle Killings M.D.   On: 12/13/2015 13:09   Dg Chest Portable 1 View  01/02/2016  CLINICAL DATA:  Altered mental status, shortness of breath and respiratory distress. EXAM: PORTABLE CHEST 1 VIEW COMPARISON:  Chest radiograph December 16, 2015 FINDINGS: Cardiomediastinal silhouette is normal. Elevated RIGHT hemidiaphragm with bandlike density. Scattered tiny nodular densities. Mild bronchitic changes. No pleural effusion. No pneumothorax. Soft tissue planes and included osseous structure nonsuspicious. IMPRESSION: RIGHT lung base atelectasis.  Mild bronchitic changes. Stable scattered tiny nodular densities, possible  granulomas. Electronically Signed   By: Elon Alas M.D.   On: 01/02/2016 05:37   US Abdomen Limited Ruq  12/12/2015  CLINICAL DATA:  82 year old male inpatient with elevated liver function tests. EXAM: US ABDOMEN LIMITED - RIGHT UPPER QUADRANT COMPARISON:  None. FINDINGS: Gallbladder: Contracted gallbladder, limiting gallbladder evaluation. No definite gallbladder wall thickening accounting for  the contracted state. No gallstones, pericholecystic fluid or sonographic Murphy sign. Common bile duct: Diameter: 2 mm Liver: There are multiple (greater than 5) hypoechoic liver masses scattered throughout the liver measuring up to 6.3 x 5.7 x 6.4 cm in the right liver lobe. The background liver parenchymal echotexture and echogenicity are normal. Trace perihepatic ascites. IMPRESSION: 1. Multiple indeterminate hypoechoic liver masses scattered throughout the liver measuring up to 6.4 cm in the right liver lobe, worrisome for liver metastases. Further evaluation with CT abdomen/pelvis with IV and oral contrast is recommended. 2. Trace perihepatic ascites. 3. Contracted gallbladder with no gallstones. Electronically Signed   By: Ilona Sorrel M.D.   On: 12/12/2015 09:00   Microbiology: Recent Results (from the past 240 hour(s))  TECHNOLOGIST REVIEW     Status: None   Collection Time: 01/01/16  3:42 PM  Result Value Ref Range Status   Technologist Review mod shistocytes, mod helmets  Final   Labs: CBC:  Recent Labs Lab 01/01/16 1542  WBC 16.1*  NEUTROABS 13.6*  HGB 8.0*  HCT 25.5*  MCV 91.4  PLT 123XX123   Basic Metabolic Panel:  Recent Labs Lab 01/01/16 1543  NA 136  K 5.4*  CO2 18*  GLUCOSE 224*  BUN 53.0*  CREATININE 2.1*  CALCIUM 8.0*   Liver Function Tests:  Recent Labs Lab 01/01/16 1543  AST 199*  ALT 61*  ALKPHOS 490*  BILITOT 2.69*  PROT 6.2*  ALBUMIN 1.7*   Time spent: 30 minutes  Signed:  Brookley Spitler  [facility]  01/02/2016  , 11:52 AM

## 2016-01-02 NOTE — ED Notes (Signed)
Patient from [facility], has had altered level of consciousness and fluid build up for the last day, labs from SNF showed he was in kidney failure.  Patient with significant edema in all extremities.  Patient is lethargic, history of CHF.

## 2016-01-02 NOTE — Clinical Social Work Note (Signed)
Patient discharging to [facility] facility today. Discharge information transmitted to facility and ambulance transport arranged. Family is at the bedside.  Dierdre Mccalip Givens, MSW, LCSW Licensed Clinical Social Worker [place] [phone]

## 2016-01-02 NOTE — Progress Notes (Signed)
Discharge Note 01/02/2016 1515  Report called to Lino [place] at [facility]. Morphine 2mg  given to pt IV before d/c. Pts IV dc'd. Pt left with Transport.

## 2016-01-02 NOTE — H&P (Signed)
History and Physical    [name] [name] [id number] DOB: 11-Jul-1928 DOA: 01/02/2016   PCP: No PCP Per Patient Chief Complaint:  Chief Complaint  Patient presents with  . Shortness of Breath  . Altered Mental Status    HPI: [name] [name] is a 82 y.o. male with medical history significant of metastatic pancreatic cancer with liver mets diagnosed 3 weeks ago, C.Diff.  Patient had actually just had end of life conversation with his oncologist yesterday and had elected to forgo further treatment and proceed with hospice.  Last evening patient became increasingly SOB and altered and was brought in to the ED.  ED Course: After discussion by EDP with family members, they confirm his DNR/DNI code status and wish to proceed to comfort measures only.  They request no labs, imaging, etc be done at this time.  Goal of care is to get him to an inpatient hospice facility if he survives long enough and they have a bed.  Review of Systems: unable to preform due to AMS   Past Medical History  Diagnosis Date  . Hypertension   . Diabetes mellitus without complication ([place])   . Gout   . Hyperlipidemia     Past Surgical History  Procedure Laterality Date  . Esophagogastroduodenoscopy N/A 12/03/2015    Procedure: ESOPHAGOGASTRODUODENOSCOPY (EGD);  Surgeon: Milus Banister, MD;  Location: [facility];  Service: Endoscopy;  Laterality: N/A;  . Colonoscopy N/A 12/04/2015    Procedure: COLONOSCOPY;  Surgeon: Milus Banister, MD;  Location: [facility];  Service: Endoscopy;  Laterality: N/A;     reports that he has quit smoking. He has never used smokeless tobacco. He reports that he does not drink alcohol or use illicit drugs.  Allergies  Allergen Reactions  . Ace Inhibitors     Listed on allergy list in wallet; unk reason per pt  . Capoten [Captopril]     Listed on allergy list in wallet; unk reason per pt  . Glucophage [Metformin]     Listed on pt's med card under allergies, but pt currently  on metformin  . Vasotec [Enalapril]     Listed on allergy list in pt's wallet; unk reason per pt    No family history on file.  Patient is unable to tell me about any family history due to the fact that he has altered mental status and is essentially dying at this point.   Prior to Admission medications   Medication Sig Start Date End Date Taking? Authorizing Provider  atorvastatin (LIPITOR) 20 MG tablet Take 20 mg by mouth daily.    Historical Provider, MD  calcium-vitamin D (OSCAL WITH D) 500-200 MG-UNIT tablet Take 1 tablet by mouth daily with breakfast. 12/18/15   Kelvin Cellar, MD  carvedilol (COREG) 12.5 MG tablet Take 1 tablet (12.5 mg total) by mouth 2 (two) times daily with a meal. 12/18/15   Kelvin Cellar, MD  insulin glargine (LANTUS) 100 UNIT/ML injection Inject 0.1 mLs (10 Units total) into the skin at bedtime. 12/18/15   Kelvin Cellar, MD  latanoprost (XALATAN) 0.005 % ophthalmic solution Place 1 drop into both eyes at bedtime. 11/13/15   Historical Provider, MD  pantoprazole (PROTONIX) 40 MG tablet Take 1 tablet (40 mg total) by mouth daily. 12/04/15   Kelvin Cellar, MD  terazosin (HYTRIN) 5 MG capsule Take 5 mg by mouth at bedtime.     Historical Provider, MD  vancomycin (VANCOCIN) 50 mg/mL oral solution Take 2.5 mLs (125 mg total) by mouth  every 6 (six) hours. 12/18/15   Kelvin Cellar, MD    Physical Exam: Filed Vitals:   01/02/16 0445 01/02/16 0457  BP: 114/64   Pulse: 63   Temp:  96.7 F (35.9 C)  Resp: 24   SpO2: 100%       Constitutional: NAD, calm, comfortable Eyes: PERRL, lids and conjunctivae normal ENMT: Mucous membranes are moist. Posterior pharynx clear of any exudate or lesions.Normal dentition.  Neck: normal, supple, no masses, no thyromegaly Respiratory: clear to auscultation bilaterally, no wheezing, no crackles. Normal respiratory effort. No accessory muscle use.  Cardiovascular: Regular rate and rhythm, no murmurs / rubs / gallops. No  extremity edema. 2+ pedal pulses. No carotid bruits.  Abdomen: no tenderness, no masses palpated. No hepatosplenomegaly. Bowel sounds positive.  Musculoskeletal: no clubbing / cyanosis. No joint deformity upper and lower extremities. Good ROM, no contractures. Normal muscle tone.  Skin: no rashes, lesions, ulcers. No induration Neurologic: CN 2-12 grossly intact. Sensation intact, DTR normal. Strength 5/5 in all 4.  Psychiatric: Normal judgment and insight. Alert and oriented x 3. Normal mood.    Labs on Admission: I have personally reviewed following labs and imaging studies  CBC:  Recent Labs Lab 01/01/16 1542  WBC 16.1*  NEUTROABS 13.6*  HGB 8.0*  HCT 25.5*  MCV 91.4  PLT 123XX123   Basic Metabolic Panel:  Recent Labs Lab 01/01/16 1543  NA 136  K 5.4*  CO2 18*  GLUCOSE 224*  BUN 53.0*  CREATININE 2.1*  CALCIUM 8.0*   GFR: CrCl cannot be calculated (Unknown ideal weight.). Liver Function Tests:  Recent Labs Lab 01/01/16 1543  AST 199*  ALT 61*  ALKPHOS 490*  BILITOT 2.69*  PROT 6.2*  ALBUMIN 1.7*   No results for input(s): LIPASE, AMYLASE in the last 168 hours. No results for input(s): AMMONIA in the last 168 hours. Coagulation Profile: No results for input(s): INR, PROTIME in the last 168 hours. Cardiac Enzymes: No results for input(s): CKTOTAL, CKMB, CKMBINDEX, TROPONINI in the last 168 hours. BNP (last 3 results) No results for input(s): PROBNP in the last 8760 hours. HbA1C: No results for input(s): HGBA1C in the last 72 hours. CBG: No results for input(s): GLUCAP in the last 168 hours. Lipid Profile: No results for input(s): CHOL, HDL, LDLCALC, TRIG, CHOLHDL, LDLDIRECT in the last 72 hours. Thyroid Function Tests: No results for input(s): TSH, T4TOTAL, FREET4, T3FREE, THYROIDAB in the last 72 hours. Anemia Panel:  Recent Labs  01/01/16 1542  RETICCTPCT 4.02*   Urine analysis:    Component Value Date/Time   COLORURINE YELLOW 12/11/2015 0836     APPEARANCEUR CLOUDY* 12/11/2015 0836   LABSPEC 1.009 12/11/2015 0836   PHURINE 5.0 12/11/2015 0836   GLUCOSEU NEGATIVE 12/11/2015 0836   HGBUR SMALL* 12/11/2015 0836   BILIRUBINUR NEGATIVE 12/11/2015 0836   KETONESUR NEGATIVE 12/11/2015 0836   PROTEINUR NEGATIVE 12/11/2015 0836   UROBILINOGEN 0.2 09/12/2013 2022   NITRITE NEGATIVE 12/11/2015 0836   LEUKOCYTESUR NEGATIVE 12/11/2015 0836   Sepsis Labs: @LABRCNTIP (procalcitonin:4,lacticidven:4) ) Recent Results (from the past 240 hour(s))  TECHNOLOGIST REVIEW     Status: None   Collection Time: 01/01/16  3:42 PM  Result Value Ref Range Status   Technologist Review mod shistocytes, mod helmets  Final     Radiological Exams on Admission: No results found.  EKG: Independently reviewed.  Assessment/Plan Principal Problem:   End of life care Active Problems:   Pancreatic cancer metastasized to liver [facility])   MODS (multiple organ  dysfunction syndrome)   Severe sepsis with acute organ dysfunction ([place]) Probably DIC as well given schistiocytes on smear 14 hours ago but no way to confirm this.  End of life care - Patient appears to be dying of sepsis with MODS.  He has underlying terminal metastatic pancreatic adenocarcinoma.  Family wishes for comfort measures.  They do not want him to go back to his nursing home.  If possible they would like an inpatient hospice facility.  Morphine PRN pain  No lab draws, imaging, IVF, ABx, etc  Comfort measures only  Call palliative care consult later this morning for inpatient hospice placement if patient survives long enough.     DVT prophylaxis: Comfort measures only Code Status: Comfort measures only Family Communication: Family at bedside Consults called: None Admission status: Admit to inpatient for end of life care   Etta Quill DO [facility] Pager [phone] from 7PM-7AM  If 7AM-7PM, please contact the day physician for the patient [email or web address] Password  [email or web address]  01/02/2016, 5:20 AM

## 2016-01-02 NOTE — Care Management Obs Status (Signed)
[place] NOTIFICATION   Patient Details  Name: [name] [name] MRN: [id number] Date of Birth: July 22, 1928   Medicare Observation Status Notification Given:   Pt changed to OBS status, code 43 noted in record. Pt d/c prior to code 44 being given to family.     Setsuko Robins, Rory Percy, RN 01/02/2016, 3:49 PM

## 2016-01-02 NOTE — Care Management Note (Signed)
Case Management Note  Patient Details  Name: MICHIAL BREHENY MRN: [id number] Date of Birth: 08-15-1928  Subjective/Objective:       CM following for progression and d/c planning.              Action/Plan: 01/02/2016 Noted plan for pt to d/c today to [facility] for comfort care, no HH or DME needs. Pt transported to [facility] by ambulance.   Expected Discharge Date:     01/02/2016             Expected Discharge Plan:  [place]  In-House Referral:  Clinical Social Work  Discharge planning Services  NA  Post Acute Care Choice:  NA Choice offered to:  NA  DME Arranged:   NA DME Agency:   NA  HH Arranged:  NA HH Agency:  NA  Status of Service:  Completed, signed off  If discussed at [employer] of Stay Meetings, dates discussed:    Additional Comments:  Adron Bene, RN 01/02/2016, 3:47 PM

## 2016-01-02 NOTE — ED Provider Notes (Signed)
TIME SEEN:  By signing my name below, I, Arianna Nassar, attest that this documentation has been prepared under the direction and in the presence of [employer], DO.  Electronically Signed: Julien Nordmann, ED Scribe. 01/02/2016. 4:12 AM.   CHIEF COMPLAINT:  Chief Complaint  Patient presents with  . Shortness of Breath  . Altered Mental Status   LEVEL 5 CAVEAT  HPI:  HPI Comments: [name] [name] is a 82 y.o. male brought in by ambulance, who has a PMHx of HTN, DM, pancreatic cancer, gout and HLD presents to the Emergency Department With shallow respirations, lower extremity swelling. EMS states that according to the [facility] that pt currently lives in, she has been having a couple of days of bilateral leg edema. Tonight, staff noted he was more lethargic than normal and began to have sudden shortness of breath. EMS says pt was replying with one word sentences. Currently not answering questions. Pt is on oral vancomycin since June 13th for c-diff. Pt is full code and per EMS, family was getting hospice in place for pt.  White count of 20,000 on 6/12, hemoglobin of 8, creatinine 1.8.   LEVEL 5 CAVEAT FOR AMS  PAST MEDICAL HISTORY/PAST SURGICAL HISTORY:  Past Medical History  Diagnosis Date  . Hypertension   . Diabetes mellitus without complication ([place])   . Gout   . Hyperlipidemia     MEDICATIONS:  Prior to Admission medications   Medication Sig Start Date End Date Taking? Authorizing Provider  atorvastatin (LIPITOR) 20 MG tablet Take 20 mg by mouth daily.    Historical Provider, MD  calcium-vitamin D (OSCAL WITH D) 500-200 MG-UNIT tablet Take 1 tablet by mouth daily with breakfast. 12/18/15   Kelvin Cellar, MD  carvedilol (COREG) 12.5 MG tablet Take 1 tablet (12.5 mg total) by mouth 2 (two) times daily with a meal. 12/18/15   Kelvin Cellar, MD  insulin glargine (LANTUS) 100 UNIT/ML injection Inject 0.1 mLs (10 Units total) into the skin at bedtime. 12/18/15    Kelvin Cellar, MD  latanoprost (XALATAN) 0.005 % ophthalmic solution Place 1 drop into both eyes at bedtime. 11/13/15   Historical Provider, MD  pantoprazole (PROTONIX) 40 MG tablet Take 1 tablet (40 mg total) by mouth daily. 12/04/15   Kelvin Cellar, MD  terazosin (HYTRIN) 5 MG capsule Take 5 mg by mouth at bedtime.     Historical Provider, MD  vancomycin (VANCOCIN) 50 mg/mL oral solution Take 2.5 mLs (125 mg total) by mouth every 6 (six) hours. 12/18/15   Kelvin Cellar, MD    ALLERGIES:  Allergies  Allergen Reactions  . Ace Inhibitors     Listed on allergy list in wallet; unk reason per pt  . Capoten [Captopril]     Listed on allergy list in wallet; unk reason per pt  . Glucophage [Metformin]     Listed on pt's med card under allergies, but pt currently on metformin  . Vasotec [Enalapril]     Listed on allergy list in pt's wallet; unk reason per pt    SOCIAL HISTORY:  Social History  Substance Use Topics  . Smoking status: Former Research scientist (life sciences)  . Smokeless tobacco: Never Used  . Alcohol Use: No    FAMILY HISTORY: No family history on file.  EXAM: There were no vitals taken for this visit. CONSTITUTIONAL: Patient is alert but not answer questions or follow commands, elderly, shallow respirations, chronically ill-appearing HEAD: Normocephalic EYES: Conjunctivae clear, PERRL ENT: normal nose; no rhinorrhea; moist  mucous membranes NECK: Supple, no meningismus, no LAD  CARD: RRR; S1 and S2 appreciated; no murmurs, no clicks, no rubs, no gallops RESP: Normal chest excursion without splinting or tachypnea; breath sounds clear and equal bilaterally; no wheezes, no rhonchi, no rales, no hypoxia or respiratory distress, speaking full sentences ABD/GI: Normal bowel sounds; non-distended; soft, non-tender, no rebound, no guarding, no peritoneal signs BACK:  The back appears normal and is non-tender to palpation, there is no CVA tenderness, stage I sacral decubitus breakdown EXT: Normal ROM  in all joints; non-tender to palpation; bilateral lower extremity 2+ pitting edema; normal capillary refill; no cyanosis, no calf tenderness or swelling    SKIN: Normal color for age and race; warm; no rash NEURO: Moves all extremities equally, does not answer questions or follow commands, does open eyes spontaneously  MEDICAL DECISION MAKING: Patient here with altered mental status, shallow respirations, lower extremity swelling. Family on the way to the emergency department. At this time he is a full code. We'll start labs, urine, chest x-ray, head CT. We'll discuss with family further wishes for care.  ED PROGRESS: Patient's daughters at bedside. They are patient's health care POA. He reports that patient decided today with his oncologist that he does not want any further treatment. He wants to be made comfortable. They state he is a DO NOT RESUSCITATE/DO NOT INTUBATE. They report that over the past several days he has been progressively weaker and lost his appetite. They state that normally he is "talkative" and able to ambulate on his own. They do not want any labs, urine, imaging performed today. They would like him admitted to the hospital until hospice bed becomes available. They do not one him transferred back to his nursing facility. We'll give morphine for discomfort. Will discuss with hospitalist for admission.   5:05 AM  Discussed patient's case with hospitalist, Dr. Alcario Drought.  Recommend admission to medical, inpatient bed.  I will place holding orders per their request. Patient and family (if present) updated with plan. Care transferred to hospitalist service.  I reviewed all nursing notes, vitals, pertinent old records, EKGs, labs, imaging (as available).    EKG Interpretation  Date/Time:  Tuesday January 02 2016 04:03:01 EDT Ventricular Rate:  68 PR Interval:    QRS Duration: 134 QT Interval:  461 QTC Calculation: 491 R Axis:   -87 Text Interpretation:  Sinus rhythm Right bundle  branch block No significant change since last tracing Confirmed by Shiloh Swopes,  DO, Glennie Rodda [id number]) on 01/02/2016 5:01:34 AM        I personally performed the services described in this documentation, which was scribed in my presence. The recorded information has been reviewed and is accurate.   [place], DO 01/02/16 [phone]

## 2016-01-02 NOTE — Consult Note (Addendum)
[facility] room available for Mr. Strutz this morning. Met with family at bedside to confirm interest. Will follow up shortly to complete paper work for transfer today. Will update CSW when paper work complete.   Paper work complete.  Discharge summary faxed to [phone].  RN please call report to [phone]  Thank you,  Erling Conte, LCSW [phone]

## 2016-01-02 NOTE — Progress Notes (Signed)
Nutrition Brief Note  Pt identified for having a low braden scale score. Chart reviewed. Pt is comfort measures only.  No nutrition interventions warranted at this time.  Please re-consult as needed.   Corrin Parker, MS, RD, LDN Pager # [phone] After hours/ weekend pager # [phone]

## 2016-01-03 ENCOUNTER — Other Ambulatory Visit: Payer: Medicare Other

## 2016-01-03 ENCOUNTER — Inpatient Hospital Stay: Payer: Medicare Other | Admitting: Hematology

## 2016-01-06 DEATH — deceased

## 2016-02-06 ENCOUNTER — Ambulatory Visit: Payer: Medicare Other | Admitting: Gastroenterology

## 2016-04-01 ENCOUNTER — Ambulatory Visit: Payer: Medicare Other | Admitting: Podiatry

## 2017-11-16 IMAGING — US US BIOPSY
1 series · 10 of 10 positions shown · non-contrast
Comparison: none

INDICATION: Liver lesions

[Series 1: us biopsy · 0.26mm/px · 10 of 10 slices shown]
[im 1/10]
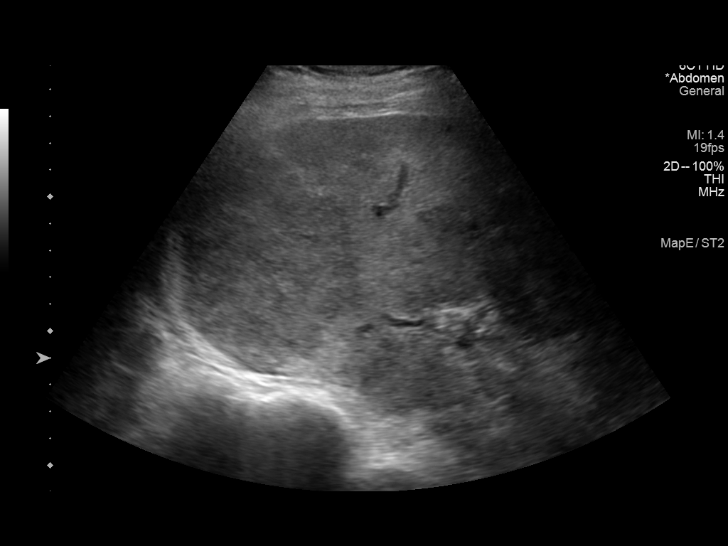
[im 2/10]
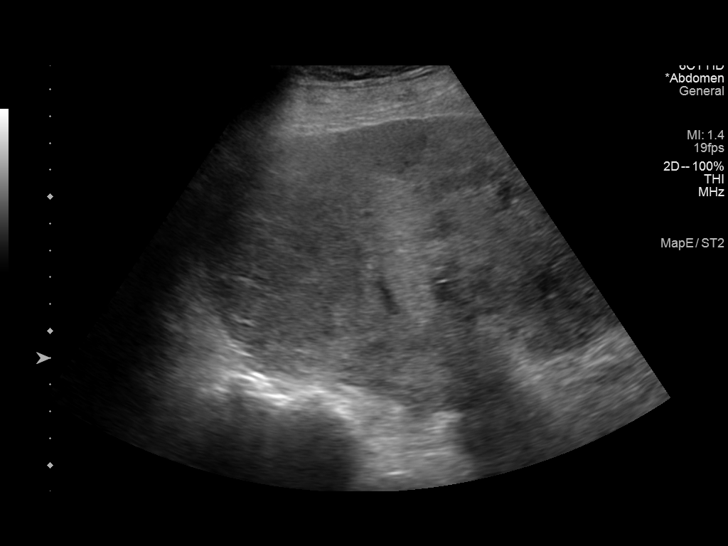
[im 3/10]
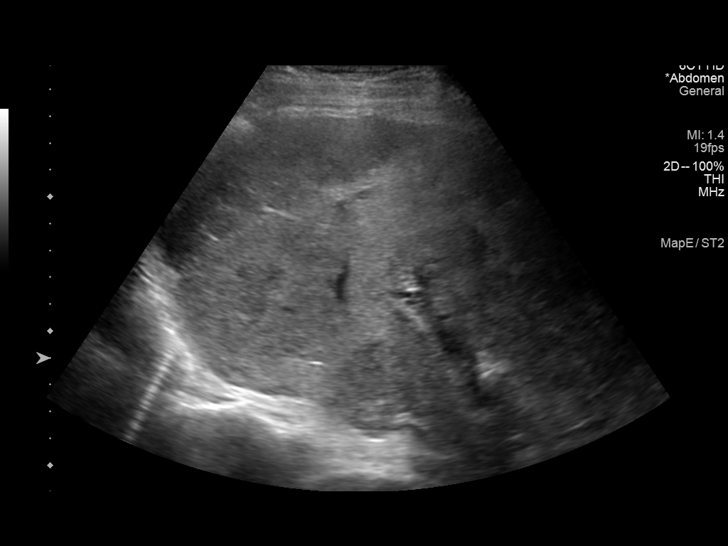
[im 4/10]
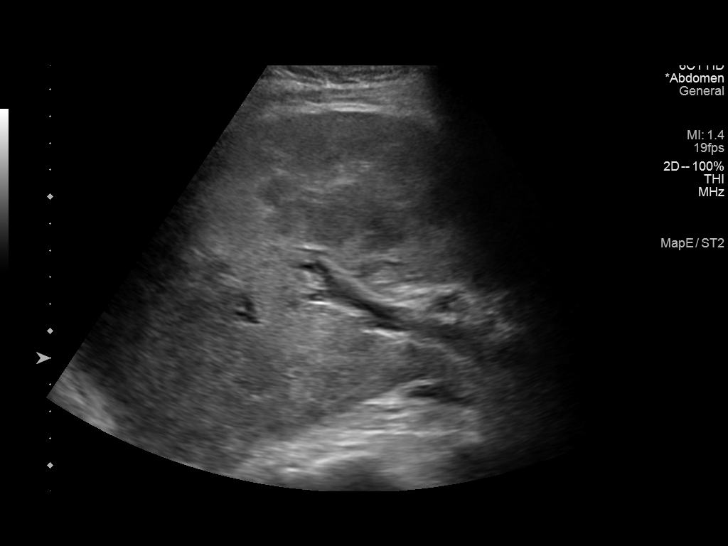
[im 5/10]
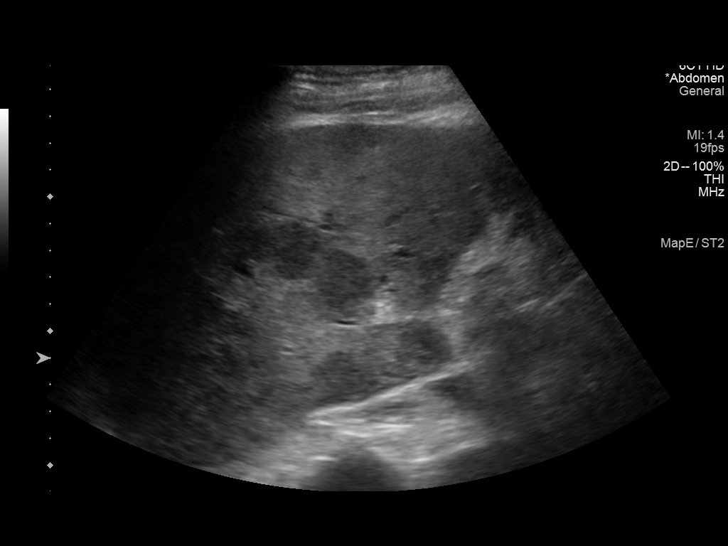
[im 6/10]
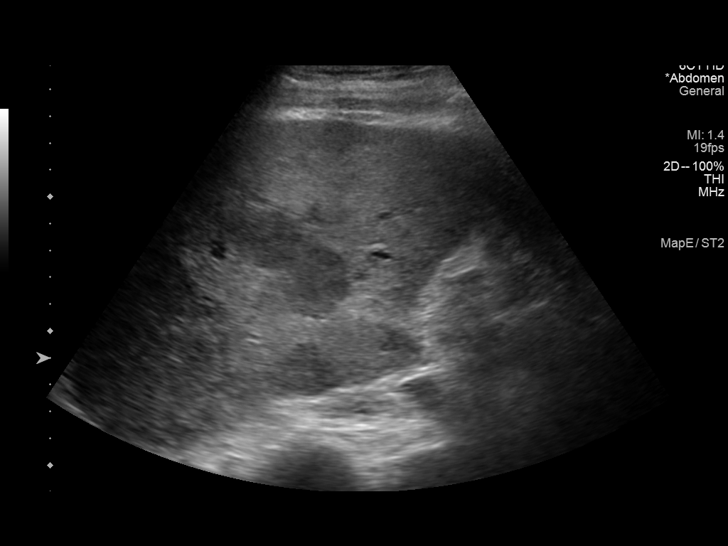
[im 7/10]
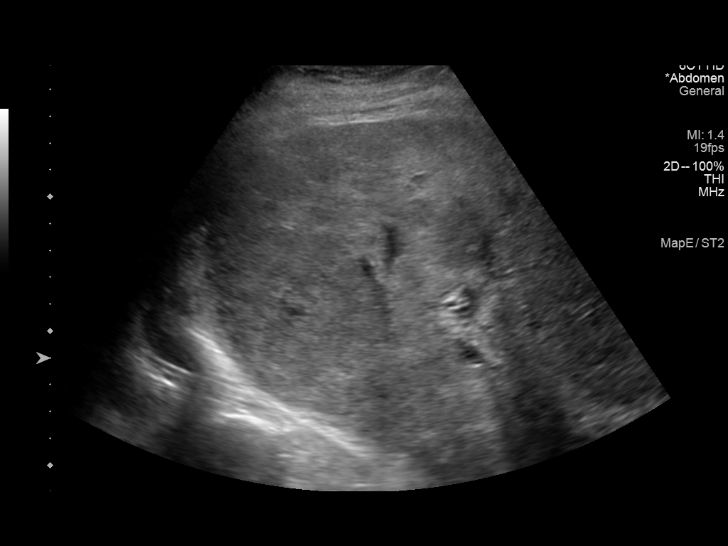
[im 8/10]
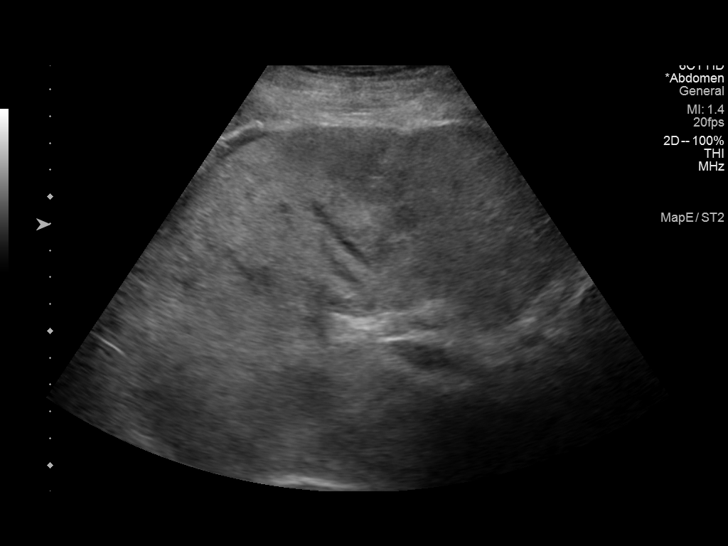
[im 9/10]
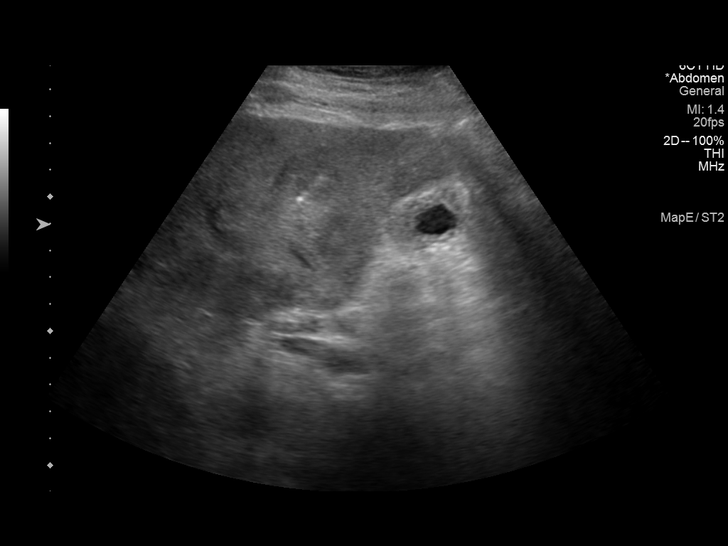
[im 10/10]
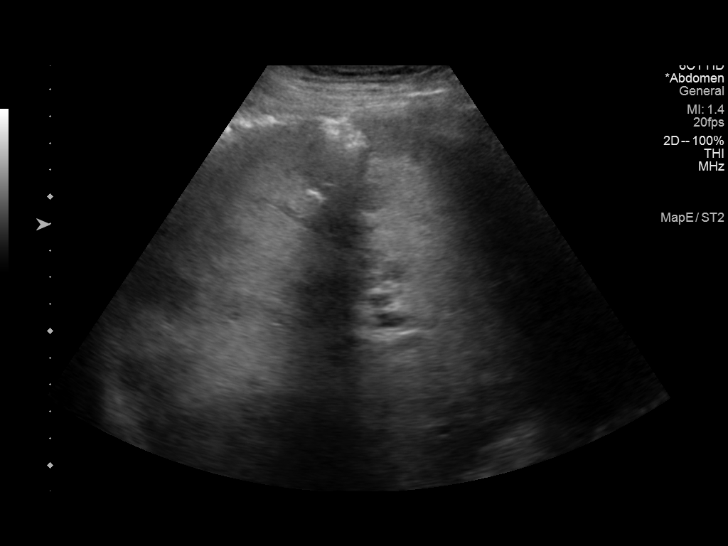

[10 of 10 positions shown; findings below may reference images not displayed]

EXAM:
ULTRASOUND-GUIDED BIOPSY OF A LIVER LESION.  CORE.

MEDICATIONS:
None.

ANESTHESIA/SEDATION:
Fentanyl 25 mcg IV; Versed 1 mg IV

Moderate Sedation Time:  14

The patient was continuously monitored during the procedure by the
interventional radiology nurse under my direct supervision.

FLUOROSCOPY TIME:  Fluoroscopy Time:  minutes  seconds ( mGy).

COMPLICATIONS:
None immediate.

PROCEDURE:
Informed written consent was obtained from the patient after a
thorough discussion of the procedural risks, benefits and
alternatives. All questions were addressed. Maximal Sterile Barrier
Technique was utilized including caps, mask, sterile gowns, sterile
gloves, sterile drape, hand hygiene and skin antiseptic. A timeout
was performed prior to the initiation of the procedure.

The right flank was prepped with ChloraPrep in a sterile fashion,
and a sterile drape was applied covering the operative field. A
sterile gown and sterile gloves were used for the procedure.

Under sonographic guidance, an 17 gauge guide needle was advanced
into the right lobe of the liver, within a lesion. Subsequently 2 18
gauge core biopsies were obtained. Gel-Foam slurry was injected into
the tract. The guide needle was removed. Final imaging was
performed.

Patient tolerated the procedure well without complication. Vital
sign monitoring by nursing staff during the procedure will continue
as patient is in the special procedures unit for post procedure
observation.
FINDINGS: The images document guide needle placement within the right lobe
liver lesion. Post biopsy images demonstrate no hemorrhage.
IMPRESSION: Successful ultrasound-guided core biopsy of a right lobe liver
lesion.

## 2017-11-17 IMAGING — DX DG CHEST 1V
1 series · 1 of 1 positions shown · non-contrast
Comparison: 12/11/2015

CLINICAL DATA: Hypertension.  Diabetes

EXAM:
CHEST 1 VIEW

[chest ap]
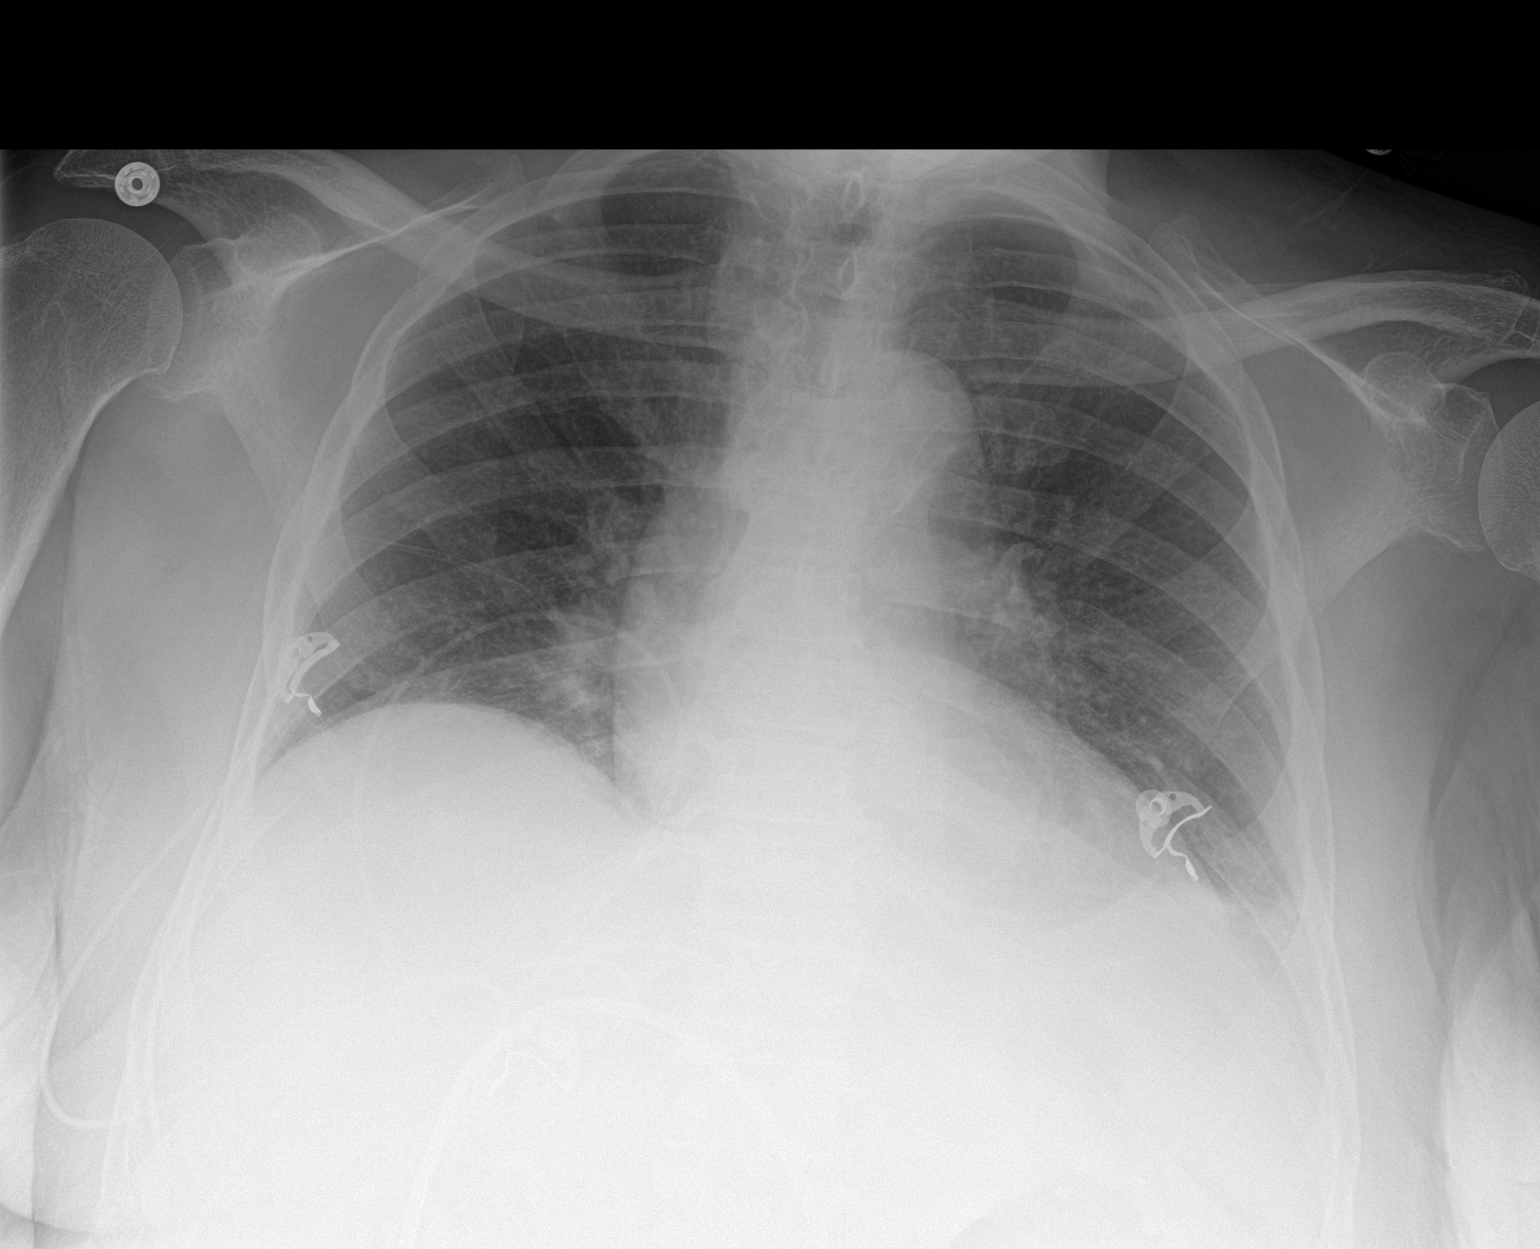

[1 of 1 positions shown; findings below may reference images not displayed]

FINDINGS: Normal heart size. Decreased lung volumes. No pleural effusion or
edema. No airspace consolidation.
IMPRESSION: 1. Decreased lung volumes.
2. No acute findings.

## 2017-11-19 IMAGING — DX DG CHEST 2V
2 series · 2 of 2 positions shown · non-contrast
Comparison: None.

CLINICAL DATA: 86-year-old male with dyspnea

EXAM:
CHEST  2 VIEW

[chest lat]
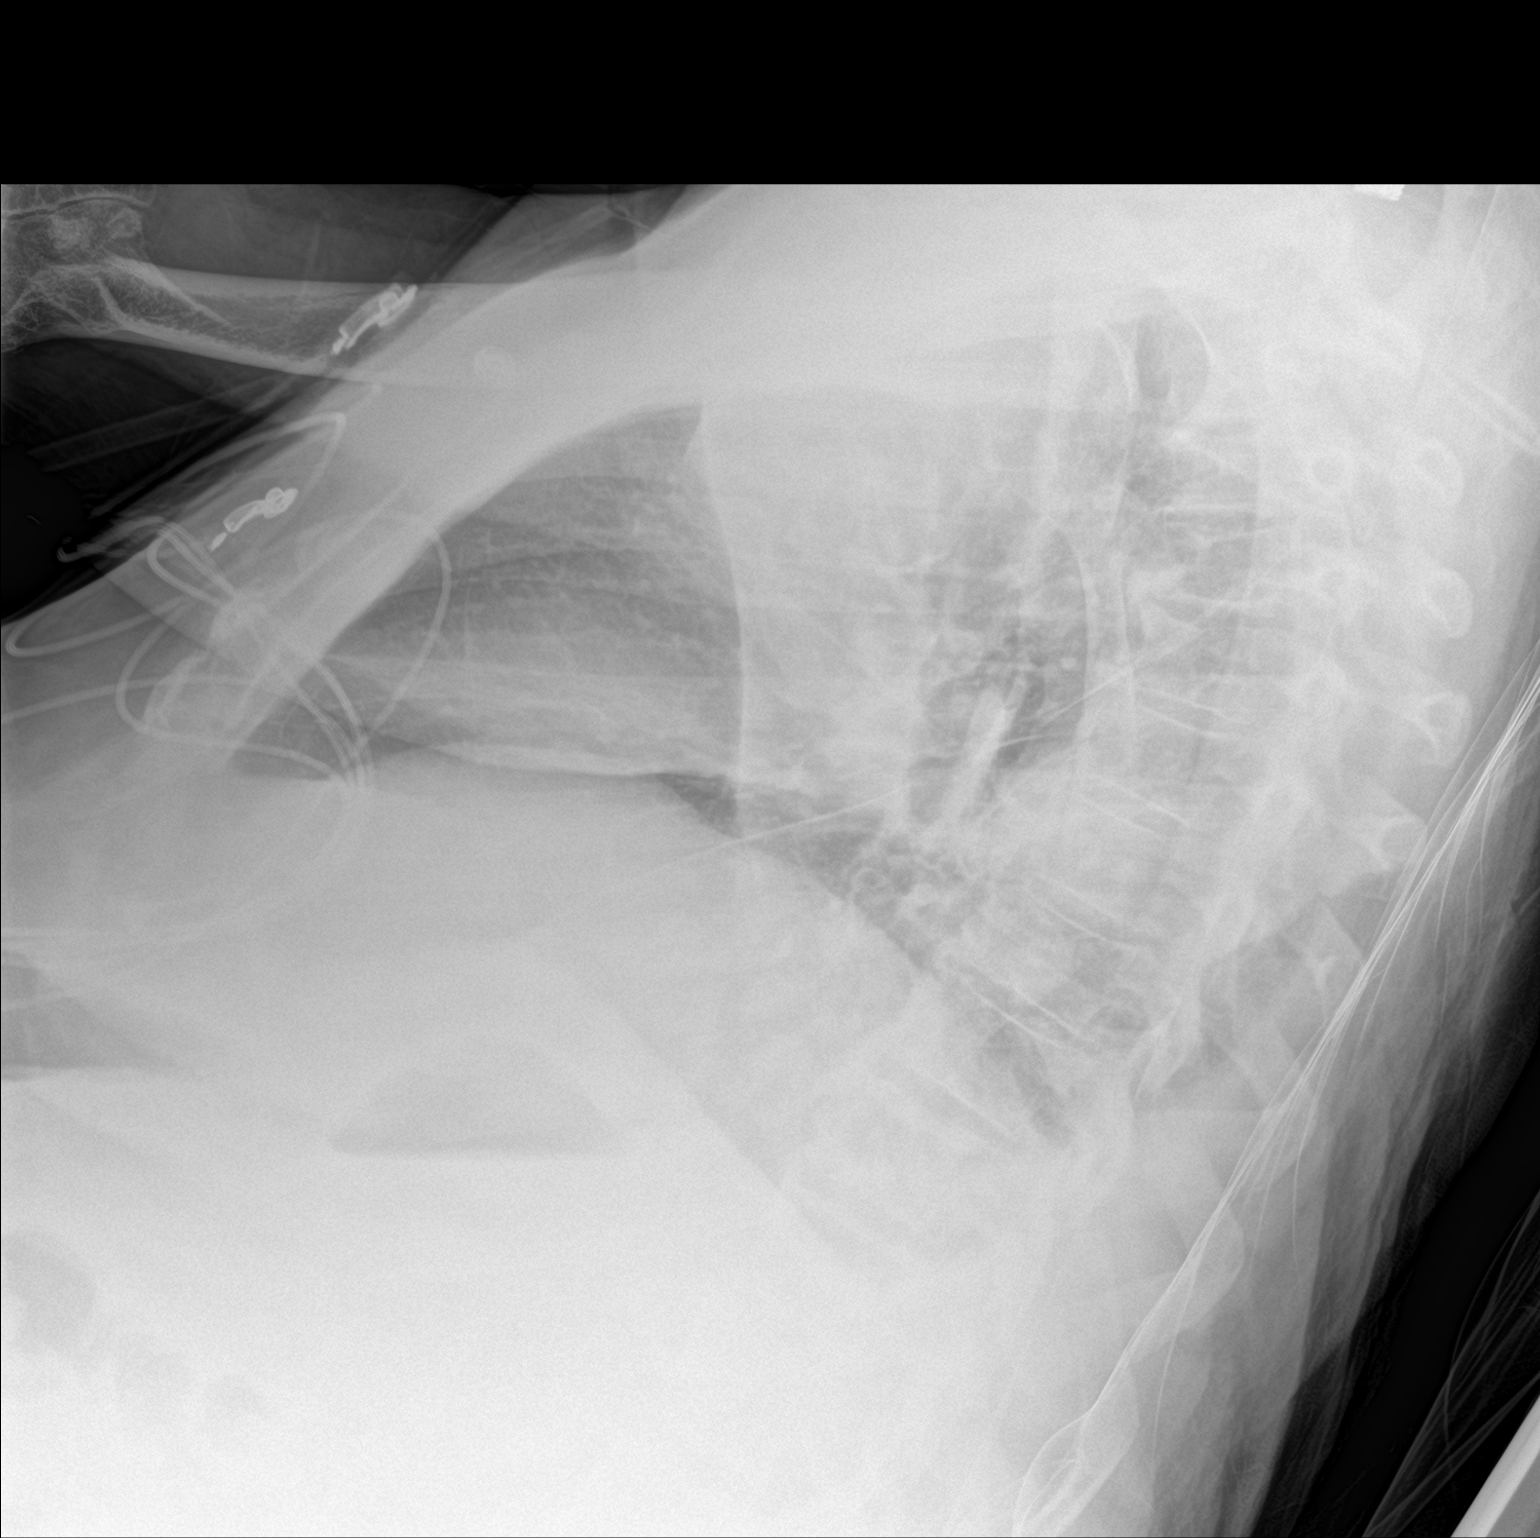

[chest ap]
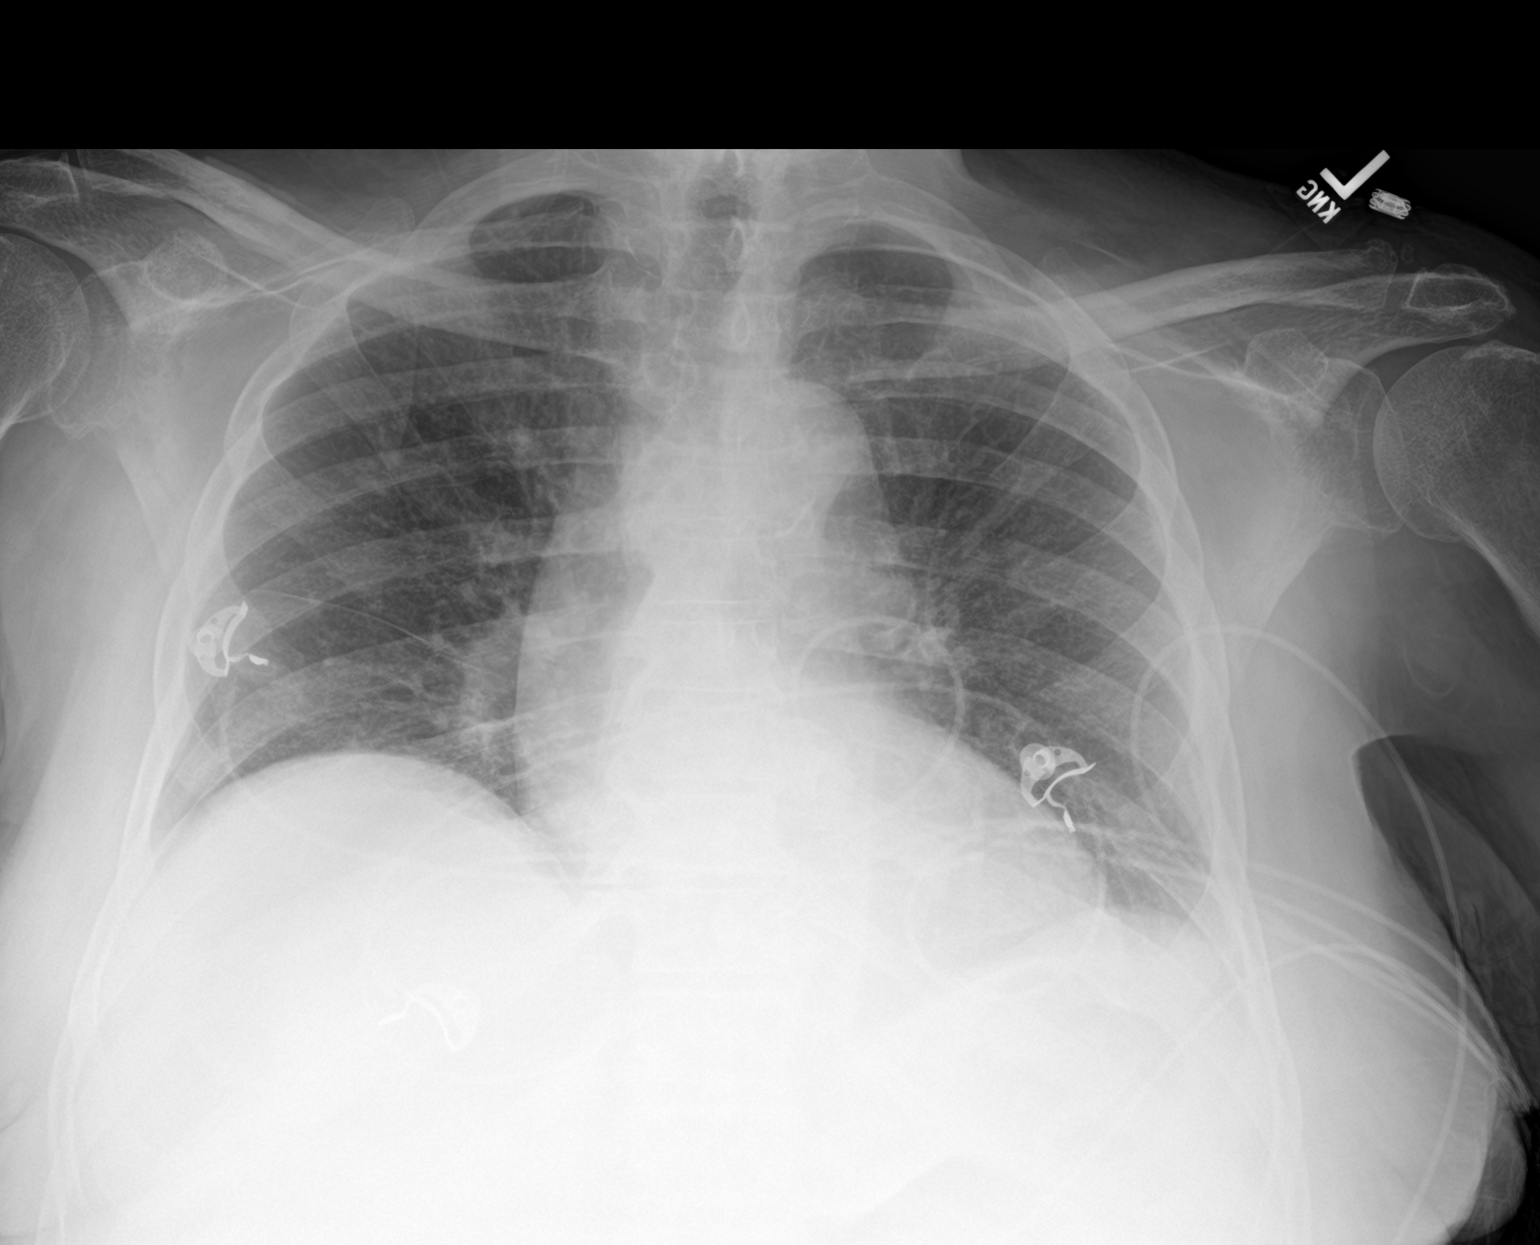

[2 of 2 positions shown; findings below may reference images not displayed]

FINDINGS: There is a focal area of slight increased density in the apical
segment of the left lower lobe seen on the lateral projection,
likely artifactual and go related to superimposition of the
structures. Pneumonia is less likely. Clinical correlation is
recommended. The lungs are otherwise clear. There is no pleural
effusion or pneumothorax. The cardiac silhouette is within normal
limits. No acute osseous pathology identified.
IMPRESSION: Artifact versus less likely focal density in the superior segment of
the left lower lobe seen on the lateral projection. Clinical
correlation is recommended.
